# Patient Record
Sex: Male | Born: 1945 | Race: White | Hispanic: No | Marital: Married | Smoking: Never smoker
Health system: Southern US, Community
[De-identification: ages and names within clinical notes are randomized; demographics above are authoritative.]

## PROBLEM LIST (undated history)

## (undated) DIAGNOSIS — H269 Unspecified cataract: Secondary | ICD-10-CM

## (undated) DIAGNOSIS — M810 Age-related osteoporosis without current pathological fracture: Secondary | ICD-10-CM

## (undated) DIAGNOSIS — E785 Hyperlipidemia, unspecified: Secondary | ICD-10-CM

## (undated) DIAGNOSIS — F32A Depression, unspecified: Secondary | ICD-10-CM

## (undated) DIAGNOSIS — G4730 Sleep apnea, unspecified: Secondary | ICD-10-CM

## (undated) DIAGNOSIS — E119 Type 2 diabetes mellitus without complications: Secondary | ICD-10-CM

## (undated) DIAGNOSIS — F419 Anxiety disorder, unspecified: Secondary | ICD-10-CM

## (undated) DIAGNOSIS — F329 Major depressive disorder, single episode, unspecified: Secondary | ICD-10-CM

## (undated) DIAGNOSIS — M1990 Unspecified osteoarthritis, unspecified site: Secondary | ICD-10-CM

## (undated) DIAGNOSIS — I639 Cerebral infarction, unspecified: Secondary | ICD-10-CM

## (undated) DIAGNOSIS — I10 Essential (primary) hypertension: Secondary | ICD-10-CM

## (undated) DIAGNOSIS — I4891 Unspecified atrial fibrillation: Secondary | ICD-10-CM

## (undated) HISTORY — DX: Unspecified cataract: H26.9

## (undated) HISTORY — DX: Sleep apnea, unspecified: G47.30

## (undated) HISTORY — PX: COLONOSCOPY: SHX174

## (undated) HISTORY — DX: Anxiety disorder, unspecified: F41.9

## (undated) HISTORY — PX: TOE AMPUTATION: SHX809

## (undated) HISTORY — DX: Age-related osteoporosis without current pathological fracture: M81.0

## (undated) HISTORY — DX: Unspecified atrial fibrillation: I48.91

## (undated) HISTORY — DX: Unspecified osteoarthritis, unspecified site: M19.90

## (undated) HISTORY — DX: Essential (primary) hypertension: I10

## (undated) HISTORY — DX: Cerebral infarction, unspecified: I63.9

## (undated) HISTORY — DX: Hyperlipidemia, unspecified: E78.5

## (undated) HISTORY — DX: Depression, unspecified: F32.A

## (undated) HISTORY — DX: Type 2 diabetes mellitus without complications: E11.9

---
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
## 2008-05-05 HISTORY — PX: CORONARY ARTERY BYPASS GRAFT: SHX141

## 2008-05-05 HISTORY — PX: COX-MAZE MICROWAVE ABLATION: SHX1404

## 2018-05-05 HISTORY — PX: ANKLE FRACTURE SURGERY: SHX122

## 2019-06-30 ENCOUNTER — Ambulatory Visit: Payer: Self-pay | Attending: Internal Medicine

## 2019-06-30 DIAGNOSIS — Z23 Encounter for immunization: Secondary | ICD-10-CM | POA: Insufficient documentation

## 2019-06-30 NOTE — Progress Notes (Signed)
   Covid-19 Vaccination Clinic  Name:  Kevin Daniel    MRN: [id number] DOB: 03/08/1946  06/30/2019  Kevin Daniel was observed post Covid-19 immunization for 15 minutes without incidence. He was provided with Vaccine Information Sheet and instruction to access the V-Safe system.   Kevin Daniel was instructed to call 911 with any severe reactions post vaccine: Marland Kitchen Difficulty breathing  . Swelling of your face and throat  . A fast heartbeat  . A bad rash all over your body  . Dizziness and weakness    Immunizations Administered    Name Date Dose VIS Date Route   Pfizer COVID-19 Vaccine 06/30/2019  2:16 PM 0.3 mL 04/15/2019 Intramuscular   Manufacturer: [place]   Lot: [id number]   [place]: [id number]

## 2019-07-27 ENCOUNTER — Ambulatory Visit: Payer: Self-pay | Attending: Internal Medicine

## 2019-07-27 DIAGNOSIS — Z23 Encounter for immunization: Secondary | ICD-10-CM

## 2019-07-27 NOTE — Progress Notes (Signed)
   Covid-19 Vaccination Clinic  Name:  Kevin Daniel    MRN: [id number] DOB: 10/04/1945  07/27/2019  Kevin Daniel was observed post Covid-19 immunization for 30 minutes based on pre-vaccination screening without incident. He was provided with Vaccine Information Sheet and instruction to access the V-Safe system.   Kevin Daniel was instructed to call 911 with any severe reactions post vaccine: Marland Kitchen Difficulty breathing  . Swelling of face and throat  . A fast heartbeat  . A bad rash all over body  . Dizziness and weakness   Immunizations Administered    Name Date Dose VIS Date Route   Pfizer COVID-19 Vaccine 07/27/2019 12:25 PM 0.3 mL 04/15/2019 Intramuscular   Manufacturer: [place]   Lot: [id number]   [place]: [id number]

## 2019-09-23 ENCOUNTER — Encounter: Payer: Self-pay | Admitting: Physician Assistant

## 2019-09-23 ENCOUNTER — Ambulatory Visit (INDEPENDENT_AMBULATORY_CARE_PROVIDER_SITE_OTHER): Payer: Medicare Other | Admitting: Physician Assistant

## 2019-09-23 ENCOUNTER — Other Ambulatory Visit: Payer: Self-pay

## 2019-09-23 VITALS — BP 108/70 | HR 64 | Temp 98.0°F | Resp 16 | Ht 66.0 in | Wt 213.0 lb

## 2019-09-23 DIAGNOSIS — L89159 Pressure ulcer of sacral region, unspecified stage: Secondary | ICD-10-CM | POA: Diagnosis not present

## 2019-09-23 DIAGNOSIS — K529 Noninfective gastroenteritis and colitis, unspecified: Secondary | ICD-10-CM

## 2019-09-23 DIAGNOSIS — N182 Chronic kidney disease, stage 2 (mild): Secondary | ICD-10-CM | POA: Diagnosis not present

## 2019-09-23 DIAGNOSIS — I10 Essential (primary) hypertension: Secondary | ICD-10-CM

## 2019-09-23 DIAGNOSIS — Z794 Long term (current) use of insulin: Secondary | ICD-10-CM

## 2019-09-23 DIAGNOSIS — E1122 Type 2 diabetes mellitus with diabetic chronic kidney disease: Secondary | ICD-10-CM

## 2019-09-23 DIAGNOSIS — E785 Hyperlipidemia, unspecified: Secondary | ICD-10-CM

## 2019-09-23 DIAGNOSIS — R5382 Chronic fatigue, unspecified: Secondary | ICD-10-CM

## 2019-09-23 DIAGNOSIS — E1159 Type 2 diabetes mellitus with other circulatory complications: Secondary | ICD-10-CM

## 2019-09-23 DIAGNOSIS — E1169 Type 2 diabetes mellitus with other specified complication: Secondary | ICD-10-CM

## 2019-09-23 MED ORDER — CEPHALEXIN 500 MG PO CAPS: 500.0000 mg | ORAL_CAPSULE | Freq: Two times a day (BID) | ORAL | 0 refills | Status: AC

## 2019-09-23 MED ORDER — RESTORE BARRIER CREA: TOPICAL_CREAM | 0 refills | Status: DC

## 2019-09-23 NOTE — Progress Notes (Signed)
Patient presents to clinic today with his wife to establish care. They have previously been followed by Dr. Alfonso Ramus in [place], [place] but this provider is too far away from their new address. Records have been requested.   Patient with known PMH significant for CVA (2020), DM II, HTN, HLD, Gout and Psoriasis. Is currently on a medication regimen of amlodipine 10 mg QD, Chlorthalidone 25 mg QD, Losartan 50 mg QD, Metoprolol 75 mg BID, Allopurinol 300 mg QD, Lexapro 20 mg QD and Trazodone 100 mg nightly. Patient was on atorvastatin and metformin but recently discontinued due to potential side effect (see below).   Patient has been having issue with loose stool, previously followed by Gastroenterology. Wife notes that they felt was related to medications but no further workup was done. Was taken off of his Metformin and Atorvastatin to see if these were big contributors to GI issue. Notes being off of this medications for a few months, still having loose caliber stools. Notes occasional bowel frequency but not a frequent issue. Denies any melena, hematochezia or tenesmus. Denies any recent change to diet. Tries to stay hydrate but wife states he could do a lot better.  Denies fever, chills, nausea or vomiting. Denies recent travel or sick contact. Denies recent antibiotic use.   Patient also having issue with a sore on his sacral region, present for a few weeks. Patient and wife note this has been present due to him sitting in his chair most of the day. Does not get up often due to concern about fall. Only has a cane as assistance device. Has noted increased tenderness in the area and also having warmth now instead of just redness. Denies drainage. Wife tries to keep the area covered as best she can.  Past Medical History:  Diagnosis Date  . Arthritis   . Depression   . Diabetes mellitus without complication ([place])   . Hyperlipidemia   . Hypertension   . Osteoporosis   . Sleep apnea     Past  Surgical History:  Procedure Laterality Date  . ANKLE FRACTURE SURGERY Left 2020  . APPENDECTOMY  1964    Current Outpatient Medications on File Prior to Visit  Medication Sig Dispense Refill  . allopurinol (ZYLOPRIM) 300 MG tablet Take 300 mg by mouth daily.    Marland Kitchen amLODipine (NORVASC) 10 MG tablet Take 1 tablet by mouth daily.    . chlorthalidone (HYGROTON) 25 MG tablet Take 1 tablet by mouth daily.    Marland Kitchen escitalopram (LEXAPRO) 20 MG tablet Take 20 mg by mouth daily.    Marland Kitchen loperamide (IMODIUM A-D) 2 MG tablet Take 2 mg by mouth as needed for diarrhea or loose stools.    Marland Kitchen losartan (COZAAR) 50 MG tablet Take 50 mg by mouth daily.    . metoprolol tartrate (LOPRESSOR) 50 MG tablet Take 75 mg by mouth 2 (two) times daily.    . traMADol (ULTRAM) 50 MG tablet Take 50 mg by mouth every 6 (six) hours as needed.    . traZODone (DESYREL) 100 MG tablet Take 100 mg by mouth at bedtime.     No current facility-administered medications on file prior to visit.    Allergies  Allergen Reactions  . Lisinopril Other (See Comments) and Swelling    Family History  Problem Relation Age of Onset  . Arthritis Mother   . COPD Mother   . Early death Mother   . Stroke Father   . Arthritis Brother   . Stroke  Brother     Social History   Socioeconomic History  . Marital status: Married    Spouse name: [place]  . Number of children: Not on file  . Years of education: Not on file  . Highest education level: Not on file  Occupational History  . Not on file  Tobacco Use  . Smoking status: Never Smoker  . Smokeless tobacco: Never Used  Substance and Sexual Activity  . Alcohol use: Yes  . Drug use: Not Currently  . Sexual activity: Not Currently  Other Topics Concern  . Not on file  Social History Narrative  . Not on file   Social Determinants of Health   Financial Resource Strain:   . Difficulty of Paying Living Expenses:   Food Insecurity:   . Worried About Charity fundraiser in the  Last Year:   . Arboriculturist in the Last Year:   Transportation Needs:   . Film/video editor (Medical):   Marland Kitchen Lack of Transportation (Non-Medical):   Physical Activity:   . Days of Exercise per Week:   . Minutes of Exercise per Session:   Stress:   . Feeling of Stress :   Social Connections:   . Frequency of Communication with Friends and Family:   . Frequency of Social Gatherings with Friends and Family:   . Attends Religious Services:   . Active Member of Clubs or Organizations:   . Attends Archivist Meetings:   Marland Kitchen Marital Status:   Intimate Partner Violence:   . Fear of Current or Ex-Partner:   . Emotionally Abused:   Marland Kitchen Physically Abused:   . Sexually Abused:    ROS Pertinent ROS are listed in the HPI.  BP 108/70   Pulse 64   Temp 98 F (36.7 C) (Temporal)   Resp 16   Ht 5\' 6"  (1.676 m)   Wt 213 lb (96.6 kg)   SpO2 98%   BMI 34.38 kg/m   Physical Exam Vitals reviewed. Exam conducted with a chaperone present.  Constitutional:      Appearance: Normal appearance.  HENT:     Head: Normocephalic and atraumatic.  Eyes:     Conjunctiva/sclera: Conjunctivae normal.  Cardiovascular:     Rate and Rhythm: Normal rate and regular rhythm.     Pulses: Normal pulses.     Heart sounds: Normal heart sounds.  Pulmonary:     Effort: Pulmonary effort is normal.     Breath sounds: Normal breath sounds.  Abdominal:     General: Bowel sounds are normal. There is no distension.     Palpations: Abdomen is soft.     Tenderness: There is no abdominal tenderness.  Musculoskeletal:     Cervical back: Neck supple.       Back:  Neurological:     General: No focal deficit present.     Mental Status: He is alert.  Psychiatric:        Mood and Affect: Mood normal.      Assessment/Plan: 1. Pressure injury of skin of sacral region, unspecified injury stage Stage II with signs of mild infection. Will initiate [facility] RN for skilled wound care. Area cleaned in office  today with non-adherent dressing applied. Discussed use of barrier creams. Rx sent. Needs to turn every 2 hours while sitting. He is mainly sedentary due to his concern of fall from chronic weakness 2/2 CVA in 2020. Unsure why he has not been given a walker. Will send in  Rx for this. Will also send in [facility] PT to facilitate safety in the home as well as strengthening exercises to make him more mobile in the safest manner possible. This should help healing and reduce risk of subsequent ulceration. Will start Keflex 500 mg BID for mild cellulitis in this region. Follow-up scheduled.   2. Chronic diarrhea Does note seem related to medications as symptoms persist despite his Metformin and Atorvastatin being stopped. Rechecking labs today to assess current status of lipids and glucose as meds need to be restart ASAP if things are not at goal. For stool, ok to continue immodium. Start daily Probiotic. BRAT diet reviewed. Will obtain labs to include CBC and stool panel. May need different GI specialist if workup negative as colonoscopy would need to be concerned.  - CBC with Differential/Platelet - Stool Culture - Ova and parasite examination - Clostridium difficile Toxin B, Qualitative, Real-Time PCR([facility])  3. Chronic fatigue Likely multifactorial -- deconditioning, depression, DM II, pain 2/2 sacral ulceration. Will begin working on each of these. Labs today as noted.   4. Type 2 diabetes mellitus with stage 2 chronic kidney disease, with long-term current use of insulin ([place]) Patient mentions at end of the visit that he is actually also on Novolin N and Novolin R. Is unsure of the doses stating he follows a sliding scale. He cannot repeat what the scale is. Has not been checking glucose. He is to start checking TID and record. Will work on [employer] for him. He is to call us ASAP with the sliding scales so we can review and make sure appropriate. I have a lot of concerns about him being on insulin  in general giving he does not keep track of sugars. Will repeat labs today to assess current glycemic state so we can make the most appropriate adjustments. May benefit from Endo input.  - Comprehensive metabolic panel - Hemoglobin A1c - TSH - Lipid panel  5. Hypertension associated with diabetes ([place]) BP stable today. Is on a regimen of chlorthalidone despite history of gout and current need for allopurinol. Will get records to see if there wa another compelling indication for him to be placed on a thiazide despite history of gout as patient is poor historian. Will review records and make appropriate changes. If no compelling indications, especially giving BP lower end of normal, would hold the chlorthalidone, monitor BP and then reassess uric acid level. We may be able to get him off of a few things.  - Comprehensive metabolic panel - Hemoglobin A1c - TSH - Lipid panel  6. Hyperlipidemia associated with type 2 diabetes mellitus ([place]) Repeat fasting labs today. - Hemoglobin A1c - TSH - Lipid panel  This visit occurred during the SARS-CoV-2 public health emergency.  Safety protocols were in place, including screening questions prior to the visit, additional usage of staff PPE, and extensive cleaning of exam room while observing appropriate contact time as indicated for disinfecting solutions.   Leeanne Rio, PA-C

## 2019-09-23 NOTE — Patient Instructions (Signed)
Please go to the lab today for blood work.  I will call you with your results. We will alter treatment regimen(s) if indicated by your results.   I am also requested records from your previous providers so I can review them and determine what all has been done previously and what care gaps need closing.   For the pressure sore, I am placing an order for a rollator walker for you to have at home so you can feel more comfortable moving around without worrying about falls. This will reduce time spent sitting on the area.  I also want you to wedge a pillow under one side of the buttock and switch to other side every hour you are sitting -- this will help alleviate pressure on the area and promote healing.  I want you to get up every hour you are sitting in the day and walk with the walker a bit, just to get off of the area. (Only if supervised).  I am sending a skilled nurse out to the home along with home health PT to do a fall risk assessment and provider some skilled wound care at the home to help speed up recovery.   I am going to give you an antibiotic because I feel you have developed a mild cellulitis in the area contributing to pain. Take the Keflex as directed.   I am sending in barrier cream to apply to the area as directed after cleaning the area with warm clean water and mild soap. Pat dry before applying.   I am also going to set you up with neurology for further evaluation of the tremors and memory changes -- due to concern of parkinsonism.   Will make other changes to medications based on lab results.

## 2019-09-24 LAB — CBC WITH DIFFERENTIAL/PLATELET
Absolute Monocytes: 904 cells/uL (ref 200–950)
Basophils Absolute: 72 cells/uL (ref 0–200)
Basophils Relative: 0.9 %
Eosinophils Absolute: 208 cells/uL (ref 15–500)
Eosinophils Relative: 2.6 %
HCT: 44 % (ref 38.5–50.0)
Hemoglobin: 15.1 g/dL (ref 13.2–17.1)
Lymphs Abs: 1096 cells/uL (ref 850–3900)
MCH: 33.3 pg — ABNORMAL HIGH (ref 27.0–33.0)
MCHC: 34.3 g/dL (ref 32.0–36.0)
MCV: 96.9 fL (ref 80.0–100.0)
MPV: 10.7 fL (ref 7.5–12.5)
Monocytes Relative: 11.3 %
Neutro Abs: 5720 cells/uL (ref 1500–7800)
Neutrophils Relative %: 71.5 %
Platelets: 238 10*3/uL (ref 140–400)
RBC: 4.54 10*6/uL (ref 4.20–5.80)
RDW: 12 % (ref 11.0–15.0)
Total Lymphocyte: 13.7 %
WBC: 8 10*3/uL (ref 3.8–10.8)

## 2019-09-24 LAB — HEMOGLOBIN A1C
Hgb A1c MFr Bld: 11.1 % of total Hgb — ABNORMAL HIGH (ref <5.7)
Mean Plasma Glucose: 272 (calc)
eAG (mmol/L): 15.1 (calc)

## 2019-09-24 LAB — COMPREHENSIVE METABOLIC PANEL
AG Ratio: 1 (calc) (ref 1.0–2.5)
ALT: 14 U/L (ref 9–46)
AST: 14 U/L (ref 10–35)
Albumin: 3.8 g/dL (ref 3.6–5.1)
Alkaline phosphatase (APISO): 132 U/L (ref 35–144)
BUN/Creatinine Ratio: 25 (calc) — ABNORMAL HIGH (ref 6–22)
BUN: 28 mg/dL — ABNORMAL HIGH (ref 7–25)
CO2: 26 mmol/L (ref 20–32)
Calcium: 9.6 mg/dL (ref 8.6–10.3)
Chloride: 88 mmol/L — ABNORMAL LOW (ref 98–110)
Creat: 1.1 mg/dL (ref 0.70–1.18)
Globulin: 3.8 g/dL (calc) — ABNORMAL HIGH (ref 1.9–3.7)
Glucose, Bld: 396 mg/dL — ABNORMAL HIGH (ref 65–99)
Potassium: 4.3 mmol/L (ref 3.5–5.3)
Sodium: 128 mmol/L — ABNORMAL LOW (ref 135–146)
Total Bilirubin: 1.2 mg/dL (ref 0.2–1.2)
Total Protein: 7.6 g/dL (ref 6.1–8.1)

## 2019-09-24 LAB — LIPID PANEL
Cholesterol: 186 mg/dL (ref <200)
HDL: 47 mg/dL (ref >=40)
LDL Cholesterol (Calc): 117 mg/dL (calc) — ABNORMAL HIGH
Non-HDL Cholesterol (Calc): 139 mg/dL (calc) — ABNORMAL HIGH (ref <130)
Total CHOL/HDL Ratio: 4 (calc) (ref <5.0)
Triglycerides: 113 mg/dL (ref <150)

## 2019-09-24 LAB — TSH: TSH: 1.88 mIU/L (ref 0.40–4.50)

## 2019-09-28 ENCOUNTER — Telehealth: Payer: Self-pay | Admitting: Physician Assistant

## 2019-09-28 NOTE — Telephone Encounter (Signed)
Received response back from nurse

## 2019-09-30 ENCOUNTER — Other Ambulatory Visit: Payer: Self-pay | Admitting: Emergency Medicine

## 2019-09-30 DIAGNOSIS — E1122 Type 2 diabetes mellitus with diabetic chronic kidney disease: Secondary | ICD-10-CM

## 2019-09-30 DIAGNOSIS — N182 Chronic kidney disease, stage 2 (mild): Secondary | ICD-10-CM

## 2019-09-30 MED ORDER — FREESTYLE LIBRE 14 DAY SENSOR MISC: 11 refills | Status: DC

## 2019-09-30 MED ORDER — METFORMIN HCL ER (OSM) 1000 MG PO TB24: 1000.0000 mg | ORAL_TABLET | Freq: Every day | ORAL | 1 refills | Status: DC

## 2019-09-30 MED ORDER — FREESTYLE LIBRE 14 DAY READER DEVI: 11 refills | Status: AC

## 2019-10-03 DIAGNOSIS — E1159 Type 2 diabetes mellitus with other circulatory complications: Secondary | ICD-10-CM | POA: Insufficient documentation

## 2019-10-03 DIAGNOSIS — R5382 Chronic fatigue, unspecified: Secondary | ICD-10-CM | POA: Insufficient documentation

## 2019-10-03 DIAGNOSIS — E1169 Type 2 diabetes mellitus with other specified complication: Secondary | ICD-10-CM | POA: Insufficient documentation

## 2019-10-03 DIAGNOSIS — E785 Hyperlipidemia, unspecified: Secondary | ICD-10-CM | POA: Insufficient documentation

## 2019-10-03 DIAGNOSIS — L89159 Pressure ulcer of sacral region, unspecified stage: Secondary | ICD-10-CM | POA: Insufficient documentation

## 2019-10-03 DIAGNOSIS — E1122 Type 2 diabetes mellitus with diabetic chronic kidney disease: Secondary | ICD-10-CM | POA: Insufficient documentation

## 2019-10-03 DIAGNOSIS — K529 Noninfective gastroenteritis and colitis, unspecified: Secondary | ICD-10-CM | POA: Insufficient documentation

## 2019-10-06 ENCOUNTER — Telehealth: Payer: Self-pay | Admitting: Physician Assistant

## 2019-10-06 ENCOUNTER — Other Ambulatory Visit: Payer: Self-pay | Admitting: Physician Assistant

## 2019-10-06 NOTE — Telephone Encounter (Signed)
Dyann Ruddle from [facility] called in asking for verbal orders to see pt for skilled nursing to treat his wound for   1 wk 1  2X a wk for 6wks  1X a wk for 2wks   Please advise and it is ok to LM on VM if no answer.   [phone]

## 2019-10-06 NOTE — Telephone Encounter (Signed)
Ok to give verbal order.

## 2019-10-07 NOTE — Telephone Encounter (Signed)
Spoke with Dyann Ruddle at [facility] and verbal order was given.

## 2019-10-09 ENCOUNTER — Encounter: Payer: Self-pay | Admitting: Physician Assistant

## 2019-10-10 MED ORDER — CHLORTHALIDONE 25 MG PO TABS: 25.0000 mg | ORAL_TABLET | Freq: Every day | ORAL | 0 refills | Status: DC

## 2019-10-10 MED ORDER — METOPROLOL TARTRATE 50 MG PO TABS: 75.0000 mg | ORAL_TABLET | Freq: Two times a day (BID) | ORAL | 0 refills | Status: DC

## 2019-10-10 MED ORDER — AMLODIPINE BESYLATE 10 MG PO TABS: 10.0000 mg | ORAL_TABLET | Freq: Every day | ORAL | 0 refills | Status: DC

## 2019-10-10 MED ORDER — LOSARTAN POTASSIUM 50 MG PO TABS: 50.0000 mg | ORAL_TABLET | Freq: Every day | ORAL | 0 refills | Status: DC

## 2019-10-10 MED ORDER — ALLOPURINOL 300 MG PO TABS: 300.0000 mg | ORAL_TABLET | Freq: Every day | ORAL | 0 refills | Status: DC

## 2019-10-10 MED ORDER — ESCITALOPRAM OXALATE 20 MG PO TABS: 20.0000 mg | ORAL_TABLET | Freq: Every day | ORAL | 0 refills | Status: DC

## 2019-10-11 ENCOUNTER — Encounter: Payer: Self-pay | Admitting: Physician Assistant

## 2019-10-14 DIAGNOSIS — I10 Essential (primary) hypertension: Secondary | ICD-10-CM

## 2019-10-14 DIAGNOSIS — E785 Hyperlipidemia, unspecified: Secondary | ICD-10-CM

## 2019-10-14 DIAGNOSIS — Z9181 History of falling: Secondary | ICD-10-CM

## 2019-10-14 DIAGNOSIS — I69398 Other sequelae of cerebral infarction: Secondary | ICD-10-CM | POA: Diagnosis not present

## 2019-10-14 DIAGNOSIS — E1122 Type 2 diabetes mellitus with diabetic chronic kidney disease: Secondary | ICD-10-CM

## 2019-10-14 DIAGNOSIS — M6281 Muscle weakness (generalized): Secondary | ICD-10-CM | POA: Diagnosis not present

## 2019-10-14 DIAGNOSIS — L89152 Pressure ulcer of sacral region, stage 2: Secondary | ICD-10-CM | POA: Diagnosis not present

## 2019-10-14 DIAGNOSIS — K529 Noninfective gastroenteritis and colitis, unspecified: Secondary | ICD-10-CM

## 2019-10-14 DIAGNOSIS — N182 Chronic kidney disease, stage 2 (mild): Secondary | ICD-10-CM

## 2019-10-14 DIAGNOSIS — E1169 Type 2 diabetes mellitus with other specified complication: Secondary | ICD-10-CM

## 2019-10-14 DIAGNOSIS — F329 Major depressive disorder, single episode, unspecified: Secondary | ICD-10-CM

## 2019-10-14 DIAGNOSIS — E1159 Type 2 diabetes mellitus with other circulatory complications: Secondary | ICD-10-CM

## 2019-10-14 DIAGNOSIS — Z7984 Long term (current) use of oral hypoglycemic drugs: Secondary | ICD-10-CM

## 2019-10-14 DIAGNOSIS — M109 Gout, unspecified: Secondary | ICD-10-CM

## 2019-10-14 DIAGNOSIS — Z4800 Encounter for change or removal of nonsurgical wound dressing: Secondary | ICD-10-CM

## 2019-10-14 DIAGNOSIS — L03818 Cellulitis of other sites: Secondary | ICD-10-CM | POA: Diagnosis not present

## 2019-10-14 DIAGNOSIS — M810 Age-related osteoporosis without current pathological fracture: Secondary | ICD-10-CM

## 2019-10-14 DIAGNOSIS — M1990 Unspecified osteoarthritis, unspecified site: Secondary | ICD-10-CM

## 2019-10-14 LAB — STOOL CULTURE
MICRO NUMBER:: 10554575
MICRO NUMBER:: 10554576
MICRO NUMBER:: 10554578
SHIGA RESULT:: NOT DETECTED
SPECIMEN QUALITY:: ADEQUATE
SPECIMEN QUALITY:: ADEQUATE
SPECIMEN QUALITY:: ADEQUATE

## 2019-10-14 LAB — OVA AND PARASITE EXAMINATION
CONCENTRATE RESULT:: NONE SEEN
MICRO NUMBER:: 10554577
SPECIMEN QUALITY:: ADEQUATE
TRICHROME RESULT:: NONE SEEN

## 2019-10-14 LAB — CLOSTRIDIUM DIFFICILE TOXIN B, QUALITATIVE, REAL-TIME PCR: Toxigenic C. Difficile by PCR: NOT DETECTED

## 2019-10-17 ENCOUNTER — Encounter: Payer: Self-pay | Admitting: Physician Assistant

## 2019-10-17 MED ORDER — LOSARTAN POTASSIUM 50 MG PO TABS: 50.0000 mg | ORAL_TABLET | Freq: Every day | ORAL | 0 refills | Status: DC

## 2019-10-27 ENCOUNTER — Telehealth: Payer: Self-pay | Admitting: Physician Assistant

## 2019-10-27 NOTE — Telephone Encounter (Signed)
Please call pt and Joelene Millin at [phone] if any changes to medications are made.

## 2019-10-27 NOTE — Telephone Encounter (Signed)
Kevin Daniel from [facility] called in stating that pt's bp has been running low. Today his BP was 84/58.  Past readings have been: 98/54, 90/56, 100/62, the highest 106/62.   Heart Rate was 50 and 63  Pt is on amlodipine, losartan, and metoprolol tartrate, Please advise

## 2019-10-27 NOTE — Telephone Encounter (Signed)
Please advise 

## 2019-10-27 NOTE — Telephone Encounter (Signed)
Called hh nurse to inform of recommendations. She asked me to call the patient because she was not currently with him. Called patient and informed in to hold the Amlodipine and schedule a follow up with PCP. Patient states he does not wish to come into the office. I gave the patient the option of a virtual visit and he declined. He states his nurse comes out on Monday and he will call and relay the information regarding his blood pressure readings to the office.

## 2019-10-27 NOTE — Telephone Encounter (Signed)
Pt needs to hold his Amlodipine and schedule f/u w/ Einar Pheasant next week when he returns.  They should continue to monitor BPs and let us know if consistently <100/50 or >145/90

## 2019-11-01 NOTE — Telephone Encounter (Signed)
Unfortunately at this point the patient has been very noncompliant with my recommendations for his care and it seems he continues to refuse advice even from Dr. Birdie Riddle in my absence. He has only been a patient a few months but in that time has refused most care recommendations. This makes it very difficult to care for him and I cannot continue to supervise his neglect of his own health. Will proceed with dismissal.   Tonia [place].

## 2019-11-03 ENCOUNTER — Encounter: Payer: Self-pay | Admitting: Physician Assistant

## 2019-11-03 ENCOUNTER — Telehealth: Payer: Self-pay | Admitting: General Practice

## 2019-11-03 ENCOUNTER — Other Ambulatory Visit: Payer: Self-pay

## 2019-11-03 DIAGNOSIS — K529 Noninfective gastroenteritis and colitis, unspecified: Secondary | ICD-10-CM

## 2019-11-03 NOTE — Telephone Encounter (Signed)
Patient and HH made aware of changes. Pt was scheduled a follow up appt on Thursday 11/10/19.

## 2019-11-03 NOTE — Progress Notes (Signed)
am

## 2019-11-03 NOTE — Telephone Encounter (Signed)
Pt needs to decrease Metoprolol to 1 tab BID rather than 1.5 tabs BID.  He should continue to hold his Amlodipine.  He needs an office visit w/ PCP to assess BPs and make further medication adjustments.  Will defer on GI consultation to PCP

## 2019-11-03 NOTE — Telephone Encounter (Signed)
Joelene Millin with called in stating that patient has been holding Amlodipine his BP is 88/56 and pulse is 60 today. He is still taking metoprolol and losartan.   Pt is also complaining of chronic diarrhea. Has been taking immodium TID and states that it is not working anymore. Would like a consult to GI for possible colonoscopy.   Ok to call [place] back and LMOVM.

## 2019-11-03 NOTE — Telephone Encounter (Signed)
Ok to place referral. Will also see patient on 11/10/19 for follow-up.

## 2019-11-04 ENCOUNTER — Telehealth: Payer: Self-pay | Admitting: Physician Assistant

## 2019-11-04 NOTE — Telephone Encounter (Signed)
I have placed home health orders in the bin up front with a charge sheet.   Order # [id number]

## 2019-11-10 ENCOUNTER — Ambulatory Visit (INDEPENDENT_AMBULATORY_CARE_PROVIDER_SITE_OTHER): Payer: Medicare Other | Admitting: Physician Assistant

## 2019-11-10 ENCOUNTER — Encounter: Payer: Self-pay | Admitting: Physician Assistant

## 2019-11-10 ENCOUNTER — Other Ambulatory Visit: Payer: Self-pay

## 2019-11-10 VITALS — BP 108/60 | HR 69 | Temp 97.9°F | Resp 16 | Ht 66.0 in | Wt 206.0 lb

## 2019-11-10 DIAGNOSIS — I152 Hypertension secondary to endocrine disorders: Secondary | ICD-10-CM

## 2019-11-10 DIAGNOSIS — E1159 Type 2 diabetes mellitus with other circulatory complications: Secondary | ICD-10-CM | POA: Diagnosis not present

## 2019-11-10 DIAGNOSIS — I10 Essential (primary) hypertension: Secondary | ICD-10-CM | POA: Diagnosis not present

## 2019-11-10 LAB — CBC WITH DIFFERENTIAL/PLATELET
Basophils Absolute: 0.2 10*3/uL — ABNORMAL HIGH (ref 0.0–0.1)
Basophils Relative: 1.4 % (ref 0.0–3.0)
Eosinophils Absolute: 0.2 10*3/uL (ref 0.0–0.7)
Eosinophils Relative: 1.5 % (ref 0.0–5.0)
HCT: 43 % (ref 39.0–52.0)
Hemoglobin: 14.1 g/dL (ref 13.0–17.0)
Lymphocytes Relative: 10.9 % — ABNORMAL LOW (ref 12.0–46.0)
Lymphs Abs: 1.3 10*3/uL (ref 0.7–4.0)
MCHC: 32.8 g/dL (ref 30.0–36.0)
MCV: 99.2 fl (ref 78.0–100.0)
Monocytes Absolute: 0.8 10*3/uL (ref 0.1–1.0)
Monocytes Relative: 6.6 % (ref 3.0–12.0)
Neutro Abs: 9.3 10*3/uL — ABNORMAL HIGH (ref 1.4–7.7)
Neutrophils Relative %: 79.6 % — ABNORMAL HIGH (ref 43.0–77.0)
Platelets: 366 10*3/uL (ref 150.0–400.0)
RBC: 4.33 Mil/uL (ref 4.22–5.81)
RDW: 14.1 % (ref 11.5–15.5)
WBC: 11.7 10*3/uL — ABNORMAL HIGH (ref 4.0–10.5)

## 2019-11-10 LAB — COMPREHENSIVE METABOLIC PANEL
ALT: 17 U/L (ref 0–53)
AST: 18 U/L (ref 0–37)
Albumin: 3.5 g/dL (ref 3.5–5.2)
Alkaline Phosphatase: 107 U/L (ref 39–117)
BUN: 19 mg/dL (ref 6–23)
CO2: 33 mEq/L — ABNORMAL HIGH (ref 19–32)
Calcium: 9.4 mg/dL (ref 8.4–10.5)
Chloride: 91 mEq/L — ABNORMAL LOW (ref 96–112)
Creatinine, Ser: 0.82 mg/dL (ref 0.40–1.50)
GFR: 91.96 mL/min (ref >60.00)
Glucose, Bld: 194 mg/dL — ABNORMAL HIGH (ref 70–99)
Potassium: 4 mEq/L (ref 3.5–5.1)
Sodium: 135 mEq/L (ref 135–145)
Total Bilirubin: 0.9 mg/dL (ref 0.2–1.2)
Total Protein: 7.4 g/dL (ref 6.0–8.3)

## 2019-11-10 NOTE — Patient Instructions (Addendum)
Please go to the lab today for blood work.  I will call you with your results. We will alter treatment regimen(s) if indicated by your results.   Please continue current medication regimen.  We will get records from Dr. Benson Norway to submit to [facility] so they can get you scheduled and taken care of.   Please follow-up after 12/24/2019 for Diabetes as this is when we can recheck A1C, etc.

## 2019-11-10 NOTE — Progress Notes (Signed)
Patient presents to clinic today with his wife for follow-up of low blood pressure readings.  Patient with history of hypertension, previously on multidrug regimen including amlodipine, metoprolol and chlorthalidone.  Physical therapy has been coming out to the house, last week noting blood pressure to be quite low 80/60.  As such metoprolol was decreased back to 1 tablet daily.  Repeat blood pressure via physical therapy still at 88 systolic.  As such amlodipine was stopped.  Patient has continued lower dose of metoprolol and chlorthalidone as directed.  Has been checking blood pressure at home noting back up to 355-7 18 systolic.  Denies noting any chest pain, shortness of breath, lightheadedness or dizziness throughout the past few weeks.   BP Readings from Last 3 Encounters:  11/10/19 108/60  09/23/19 108/70     Past Medical History:  Diagnosis Date   Arthritis    Depression    Diabetes mellitus without complication ([place])    Hyperlipidemia    Hypertension    Osteoporosis    Sleep apnea     Current Outpatient Medications on File Prior to Visit  Medication Sig Dispense Refill   allopurinol (ZYLOPRIM) 300 MG tablet Take 1 tablet (300 mg total) by mouth daily. 90 tablet 0   chlorthalidone (HYGROTON) 25 MG tablet Take 1 tablet (25 mg total) by mouth daily. 90 tablet 0   Continuous Blood Gluc Receiver (FREESTYLE LIBRE 14 DAY READER) DEVI Apply reader for monitoring of continuous glucose monitoring 1 each 11   Continuous Blood Gluc Sensor (FREESTYLE LIBRE 14 DAY SENSOR) MISC Apply sensor every 14 days for continuous blood glucose monitoring 1 each 11   escitalopram (LEXAPRO) 20 MG tablet Take 1 tablet (20 mg total) by mouth daily. 90 tablet 0   insulin NPH Human (NOVOLIN N) 100 UNIT/ML injection Inject into the skin. Sliding scale     insulin regular (NOVOLIN R) 100 units/mL injection Inject into the skin 3 (three) times daily before meals.     loperamide (IMODIUM A-D) 2  MG tablet Take 2 mg by mouth as needed for diarrhea or loose stools.     losartan (COZAAR) 50 MG tablet Take 1 tablet (50 mg total) by mouth daily. 90 tablet 0   metoprolol tartrate (LOPRESSOR) 50 MG tablet Take 1.5 tablets (75 mg total) by mouth 2 (two) times daily. (Patient taking differently: Take 50 mg by mouth 2 (two) times daily. ) 135 tablet 0   traMADol (ULTRAM) 50 MG tablet Take 50 mg by mouth every 6 (six) hours as needed.     traZODone (DESYREL) 100 MG tablet Take 100 mg by mouth at bedtime.     No current facility-administered medications on file prior to visit.    Allergies  Allergen Reactions   Lisinopril Other (See Comments) and Swelling    Family History  Problem Relation Age of Onset   Arthritis Mother    COPD Mother    Early death Mother    Stroke Father    Arthritis Brother    Stroke Brother     Social History   Socioeconomic History   Marital status: Married    Spouse name: [place]   Number of children: Not on file   Years of education: Not on file   Highest education level: Not on file  Occupational History   Not on file  Tobacco Use   Smoking status: Never Smoker   Smokeless tobacco: Never Used  Vaping Use   Vaping Use: Never used  Substance  and Sexual Activity   Alcohol use: Yes   Drug use: Not Currently   Sexual activity: Not Currently  Other Topics Concern   Not on file  Social History Narrative   Not on file   Social Determinants of Health   Financial Resource Strain:    Difficulty of Paying Living Expenses:   Food Insecurity:    Worried About Running Out of Food in the Last Year:    Arboriculturist in the Last Year:   Transportation Needs:    Film/video editor (Medical):    Lack of Transportation (Non-Medical):   Physical Activity:    Days of Exercise per Week:    Minutes of Exercise per Session:   Stress:    Feeling of Stress :   Social Connections:    Frequency of Communication with  Friends and Family:    Frequency of Social Gatherings with Friends and Family:    Attends Religious Services:    Active Member of Clubs or Organizations:    Attends Archivist Meetings:    Marital Status:    Review of Systems - See HPI.  All other ROS are negative.  Resp 16    Ht _0  (1.676 m)    Wt 206 lb (93.4 kg)    BMI 33.25 kg/m   Physical Exam Vitals reviewed.  Constitutional:      Appearance: Normal appearance.  HENT:     Head: Normocephalic and atraumatic.  Cardiovascular:     Rate and Rhythm: Normal rate and regular rhythm.     Heart sounds: Normal heart sounds.  Pulmonary:     Effort: Pulmonary effort is normal.     Breath sounds: Normal breath sounds.  Musculoskeletal:     Cervical back: Neck supple.  Neurological:     Mental Status: He is alert.  Psychiatric:        Mood and Affect: Mood normal.     Recent Results (from the past 2160 hour(s))  CBC with Differential/Platelet     Status: Abnormal   Collection Time: 09/23/19  2:04 PM  Result Value Ref Range   WBC 8.0 3.8 - 10.8 Thousand/uL   RBC 4.54 4.20 - 5.80 Million/uL   Hemoglobin 15.1 13.2 - 17.1 g/dL   HCT 44.0 38 - 50 %   MCV 96.9 80.0 - 100.0 fL   MCH 33.3 (H) 27.0 - 33.0 pg   MCHC 34.3 32.0 - 36.0 g/dL   RDW 12.0 11.0 - 15.0 %   Platelets 238 140 - 400 Thousand/uL   MPV 10.7 7.5 - 12.5 fL   Neutro Abs 5,720 1,500 - 7,800 cells/uL   Lymphs Abs 1,096 850 - 3,900 cells/uL   Absolute Monocytes 904 200 - 950 cells/uL   Eosinophils Absolute 208 15 - 500 cells/uL   Basophils Absolute 72 0 - 200 cells/uL   Neutrophils Relative % 71.5 %   Total Lymphocyte 13.7 %   Monocytes Relative 11.3 %   Eosinophils Relative 2.6 %   Basophils Relative 0.9 %  Comprehensive metabolic panel     Status: Abnormal   Collection Time: 09/23/19  2:04 PM  Result Value Ref Range   Glucose, Bld 396 (H) 65 - 99 mg/dL    Comment: .            Fasting reference interval . For someone without known  diabetes, a glucose value >125 mg/dL indicates that they may have diabetes and this should be confirmed with a follow-up  test. .    BUN 28 (H) 7 - 25 mg/dL   Creat 1.10 0.70 - 1.18 mg/dL    Comment: For patients >9 years of age, the reference limit for Creatinine is approximately 13% higher for people identified as African-American. .    BUN/Creatinine Ratio 25 (H) 6 - 22 (calc)   Sodium 128 (L) 135 - 146 mmol/L   Potassium 4.3 3.5 - 5.3 mmol/L   Chloride 88 (L) 98 - 110 mmol/L   CO2 26 20 - 32 mmol/L   Calcium 9.6 8.6 - 10.3 mg/dL   Total Protein 7.6 6.1 - 8.1 g/dL   Albumin 3.8 3.6 - 5.1 g/dL   Globulin 3.8 (H) 1.9 - 3.7 g/dL (calc)   AG Ratio 1.0 1.0 - 2.5 (calc)   Total Bilirubin 1.2 0.2 - 1.2 mg/dL   Alkaline phosphatase (APISO) 132 35 - 144 U/L   AST 14 10 - 35 U/L   ALT 14 9 - 46 U/L  Hemoglobin A1c     Status: Abnormal   Collection Time: 09/23/19  2:04 PM  Result Value Ref Range   Hgb A1c MFr Bld 11.1 (H) <5.7 % of total Hgb    Comment: For someone without known diabetes, a hemoglobin A1c value of 6.5% or greater indicates that they may have  diabetes and this should be confirmed with a follow-up  test. . For someone with known diabetes, a value <7% indicates  that their diabetes is well controlled and a value  greater than or equal to 7% indicates suboptimal  control. A1c targets should be individualized based on  duration of diabetes, age, comorbid conditions, and  other considerations. . Currently, no consensus exists regarding use of hemoglobin A1c for diagnosis of diabetes for children. .    Mean Plasma Glucose 272 (calc)   eAG (mmol/L) 15.1 (calc)  TSH     Status: None   Collection Time: 09/23/19  2:04 PM  Result Value Ref Range   TSH 1.88 0.40 - 4.50 mIU/L  Lipid panel     Status: Abnormal   Collection Time: 09/23/19  2:04 PM  Result Value Ref Range   Cholesterol 186 <200 mg/dL   HDL 47 > OR = 40 mg/dL   Triglycerides 113 <150 mg/dL   LDL  Cholesterol (Calc) 117 (H) mg/dL (calc)    Comment: Reference range: <100 . Desirable range <100 mg/dL for primary prevention;   <70 mg/dL for patients with CHD or diabetic patients  with > or = 2 CHD risk factors. Marland Kitchen LDL-C is now calculated using the Travante Knee-Hopkins  calculation, which is a validated novel method providing  better accuracy than the Friedewald equation in the  estimation of LDL-C.  Cresenciano Genre et al. Annamaria Helling. 6256;389(37): 2061-2068  (http://education.[facility].com/faq/FAQ164)    Total CHOL/HDL Ratio 4.0 <5.0 (calc)   Non-HDL Cholesterol (Calc) 139 (H) <130 mg/dL (calc)    Comment: For patients with diabetes plus 1 major ASCVD risk  factor, treating to a non-HDL-C goal of <100 mg/dL  (LDL-C of <70 mg/dL) is considered a therapeutic  option.   Stool Culture     Status: None   Collection Time: 10/07/19 12:03 PM   Specimen: Stool  Result Value Ref Range   MICRO NUMBER: [id number]    SPECIMEN QUALITY: Adequate    SOURCE: NOT GIVEN    STATUS: FINAL    SHIGA RESULT: Not Detected     Comment: Reference Range:Not Detected   MICRO NUMBER: [id number]    SPECIMEN  QUALITY: Adequate    Source NOT GIVEN    STATUS: FINAL    CAM RESULT: No enteric Campylobacter isolated    MICRO NUMBER: [id number]    SPECIMEN QUALITY: Adequate    SOURCE: NOT GIVEN    STATUS: FINAL    SS RESULT: No Salmonella or Shigella isolated   Ova and parasite examination     Status: None   Collection Time: 10/07/19 12:03 PM   Specimen: Stool  Result Value Ref Range   MICRO NUMBER: [id number]    SPECIMEN QUALITY: Adequate    Source NOT GIVEN    STATUS: FINAL    CONCENTRATE RESULT: No ova or parasites seen    TRICHROME RESULT: No ova or parasites seen    COMMENT:      Routine Ova and Parasite exam may not detect some parasites that occasionally cause diarrheal illness. Test code(s) 07218 (Cryptosporidium Ag., DFA) and/or 10018 (Cyclospora and Isospora Exam) may be ordered to detect these parasites. One  negative sample  does not necessarily rule out the presence of a parasitic infection.  For additional information, please refer to https://education.[facility].com/faq/FAQ203 (This link is being provided for informational/ educational purposes only.)   Clostridium difficile Toxin B, Qualitative, Real-Time PCR([facility])     Status: None   Collection Time: 10/07/19 12:03 PM  Result Value Ref Range   Toxigenic C. Difficile by PCR NOT DETECTED NOT DETECT    Comment: . This test is for use only with liquid or soft stools;  performance characteristics of other clinical specimen  types have not been established. . This assay was performed by Cepheid GeneXpert(R) PCR. The performance characteristics of this assay have been determined by [employer]. Performance characteristics refer to the analytical performance of the test. . For additional information, please refer to http://education.[facility].com/faq/FAQ136 (This link is being provided for informational/educational purposes only.) .     Assessment/Plan: 1. Hypertension associated with diabetes ([place]) Blood pressure much improved today.  Patient asymptomatic.  We will continue current regimen.  We will recheck CBC and CMP levels today. - CBC w/Diff - Comp Met (CMET)   This visit occurred during the SARS-CoV-2 public health emergency.  Safety protocols were in place, including screening questions prior to the visit, additional usage of staff PPE, and extensive cleaning of exam room while observing appropriate contact time as indicated for disinfecting solutions.    Leeanne Rio, PA-C

## 2019-11-11 ENCOUNTER — Other Ambulatory Visit: Payer: Self-pay | Admitting: Emergency Medicine

## 2019-11-11 DIAGNOSIS — D72829 Elevated white blood cell count, unspecified: Secondary | ICD-10-CM

## 2019-11-11 NOTE — Telephone Encounter (Signed)
Update --patient has become more interested in taking care of his chronic health issues, and tending appointments as directed and following directions.  Has also agreed to referral back to GI for chronic medical issues which has been placed.  As such we will cancel any plans of potential dismissal at this time.

## 2019-11-15 NOTE — Telephone Encounter (Signed)
Faxed the orders back to [facility].

## 2019-11-17 ENCOUNTER — Telehealth: Payer: Self-pay | Admitting: Physician Assistant

## 2019-11-17 NOTE — Telephone Encounter (Signed)
FYI

## 2019-11-17 NOTE — Telephone Encounter (Signed)
Joelene Millin a nurse with home care called in stating that the pt wanted her to make [facility] aware that he has stopped taking the Novolin R and has only been taking the Novolin N and that seems to be working well for him.

## 2019-11-21 ENCOUNTER — Ambulatory Visit (INDEPENDENT_AMBULATORY_CARE_PROVIDER_SITE_OTHER): Payer: Medicare Other

## 2019-11-21 ENCOUNTER — Other Ambulatory Visit: Payer: Self-pay

## 2019-11-21 DIAGNOSIS — D72829 Elevated white blood cell count, unspecified: Secondary | ICD-10-CM

## 2019-11-21 LAB — CBC WITH DIFFERENTIAL/PLATELET
Basophils Absolute: 0.1 10*3/uL (ref 0.0–0.1)
Basophils Relative: 0.9 % (ref 0.0–3.0)
Eosinophils Absolute: 0.2 10*3/uL (ref 0.0–0.7)
Eosinophils Relative: 2 % (ref 0.0–5.0)
HCT: 41.3 % (ref 39.0–52.0)
Hemoglobin: 14 g/dL (ref 13.0–17.0)
Lymphocytes Relative: 15.1 % (ref 12.0–46.0)
Lymphs Abs: 1.4 10*3/uL (ref 0.7–4.0)
MCHC: 33.8 g/dL (ref 30.0–36.0)
MCV: 97.9 fl (ref 78.0–100.0)
Monocytes Absolute: 0.7 10*3/uL (ref 0.1–1.0)
Monocytes Relative: 7.8 % (ref 3.0–12.0)
Neutro Abs: 7 10*3/uL (ref 1.4–7.7)
Neutrophils Relative %: 74.2 % (ref 43.0–77.0)
Platelets: 357 10*3/uL (ref 150.0–400.0)
RBC: 4.22 Mil/uL (ref 4.22–5.81)
RDW: 14.1 % (ref 11.5–15.5)
WBC: 9.4 10*3/uL (ref 4.0–10.5)

## 2019-11-22 ENCOUNTER — Telehealth: Payer: Self-pay | Admitting: Physician Assistant

## 2019-11-22 NOTE — Telephone Encounter (Signed)
I have placed home health order in the bin up front with a change sheet from [place].   Please advise

## 2019-11-22 NOTE — Telephone Encounter (Signed)
Home health paperwork in your folder for review

## 2019-11-23 NOTE — Telephone Encounter (Signed)
Reviewed. Paperwork given to Supervising MD for signature. Will fax once signed.

## 2019-11-29 ENCOUNTER — Encounter: Payer: Self-pay | Admitting: Physician Assistant

## 2019-11-29 NOTE — Telephone Encounter (Signed)
Can you check on status of this and follow-up with patient. In referral notes says they were getting records from previous GI -- Dr. Benson Norway -- but that note was from almost a month ago.

## 2019-12-07 ENCOUNTER — Encounter: Payer: Self-pay | Admitting: Gastroenterology

## 2019-12-17 ENCOUNTER — Other Ambulatory Visit: Payer: Self-pay | Admitting: Family Medicine

## 2019-12-20 ENCOUNTER — Encounter: Payer: Self-pay | Admitting: Physician Assistant

## 2019-12-21 ENCOUNTER — Other Ambulatory Visit: Payer: Self-pay | Admitting: Emergency Medicine

## 2019-12-21 MED ORDER — TRAZODONE HCL 100 MG PO TABS: 100.0000 mg | ORAL_TABLET | Freq: Every day | ORAL | 1 refills | Status: DC

## 2019-12-26 ENCOUNTER — Encounter: Payer: Self-pay | Admitting: Physician Assistant

## 2019-12-26 ENCOUNTER — Ambulatory Visit (INDEPENDENT_AMBULATORY_CARE_PROVIDER_SITE_OTHER): Payer: Medicare Other | Admitting: Physician Assistant

## 2019-12-26 ENCOUNTER — Ambulatory Visit (INDEPENDENT_AMBULATORY_CARE_PROVIDER_SITE_OTHER): Payer: Medicare Other

## 2019-12-26 ENCOUNTER — Other Ambulatory Visit: Payer: Self-pay

## 2019-12-26 VITALS — BP 110/80 | HR 94 | Temp 98.2°F | Resp 16 | Ht 66.0 in | Wt 210.0 lb

## 2019-12-26 DIAGNOSIS — E1159 Type 2 diabetes mellitus with other circulatory complications: Secondary | ICD-10-CM

## 2019-12-26 DIAGNOSIS — Z794 Long term (current) use of insulin: Secondary | ICD-10-CM | POA: Diagnosis not present

## 2019-12-26 DIAGNOSIS — N182 Chronic kidney disease, stage 2 (mild): Secondary | ICD-10-CM | POA: Diagnosis not present

## 2019-12-26 DIAGNOSIS — Z0000 Encounter for general adult medical examination without abnormal findings: Secondary | ICD-10-CM

## 2019-12-26 DIAGNOSIS — Z23 Encounter for immunization: Secondary | ICD-10-CM

## 2019-12-26 DIAGNOSIS — E1169 Type 2 diabetes mellitus with other specified complication: Secondary | ICD-10-CM | POA: Diagnosis not present

## 2019-12-26 DIAGNOSIS — E785 Hyperlipidemia, unspecified: Secondary | ICD-10-CM

## 2019-12-26 DIAGNOSIS — I10 Essential (primary) hypertension: Secondary | ICD-10-CM

## 2019-12-26 DIAGNOSIS — E1122 Type 2 diabetes mellitus with diabetic chronic kidney disease: Secondary | ICD-10-CM | POA: Diagnosis not present

## 2019-12-26 LAB — COMPREHENSIVE METABOLIC PANEL
ALT: 12 U/L (ref 0–53)
AST: 15 U/L (ref 0–37)
Albumin: 3.6 g/dL (ref 3.5–5.2)
Alkaline Phosphatase: 96 U/L (ref 39–117)
BUN: 26 mg/dL — ABNORMAL HIGH (ref 6–23)
CO2: 28 mEq/L (ref 19–32)
Calcium: 9.3 mg/dL (ref 8.4–10.5)
Chloride: 96 mEq/L (ref 96–112)
Creatinine, Ser: 0.78 mg/dL (ref 0.40–1.50)
GFR: 97.39 mL/min (ref >60.00)
Glucose, Bld: 200 mg/dL — ABNORMAL HIGH (ref 70–99)
Potassium: 3.9 mEq/L (ref 3.5–5.1)
Sodium: 135 mEq/L (ref 135–145)
Total Bilirubin: 0.6 mg/dL (ref 0.2–1.2)
Total Protein: 7.5 g/dL (ref 6.0–8.3)

## 2019-12-26 LAB — LIPID PANEL
Cholesterol: 194 mg/dL (ref 0–200)
HDL: 49 mg/dL (ref >39.00)
LDL Cholesterol: 116 mg/dL — ABNORMAL HIGH (ref 0–99)
NonHDL: 145.49
Total CHOL/HDL Ratio: 4
Triglycerides: 145 mg/dL (ref 0.0–149.0)
VLDL: 29 mg/dL (ref 0.0–40.0)

## 2019-12-26 LAB — HEMOGLOBIN A1C: Hgb A1c MFr Bld: 6.7 % — ABNORMAL HIGH (ref 4.6–6.5)

## 2019-12-26 NOTE — Progress Notes (Signed)
History of Present Illness: Patient is a 74 y.o. male who presents to clinic today for follow-up of Diabetes Mellitus II, uncontrolled with peripheral neuropathy.  Patient currently on medication regimen of Novolin N 35 units daily and Novolin R following sliding scale.  Endorses taking medications as directed. Endorses watching diet and overall limiting carb.  Denies change to vision, numbness or tingling of hands and feet. Notes good urinary output. Endorses checking blood glucose as directed. Fasting glucose averaging 140s. .   Latest Maintenance: A1C --  Lab Results  Component Value Date   HGBA1C 11.1 (H) 09/23/2019   Diabetic Eye Exam -- Appt scheduled 01/05/20 Urine Microalbumin -- On ARB daily. BP stable.  Foot Exam -- Due. Denies concerns today.    Past Medical History:  Diagnosis Date  . Arthritis   . Depression   . Diabetes mellitus without complication ([place])   . Hyperlipidemia   . Hypertension   . Osteoporosis   . Sleep apnea     Current Outpatient Medications on File Prior to Visit  Medication Sig Dispense Refill  . allopurinol (ZYLOPRIM) 300 MG tablet Take 1 tablet (300 mg total) by mouth daily. 90 tablet 0  . chlorthalidone (HYGROTON) 25 MG tablet Take 1 tablet (25 mg total) by mouth daily. 90 tablet 0  . Continuous Blood Gluc Receiver (FREESTYLE LIBRE 14 DAY READER) DEVI Apply reader for monitoring of continuous glucose monitoring 1 each 11  . Continuous Blood Gluc Sensor (FREESTYLE LIBRE 14 DAY SENSOR) MISC Apply sensor every 14 days for continuous blood glucose monitoring 1 each 11  . escitalopram (LEXAPRO) 20 MG tablet Take 1 tablet (20 mg total) by mouth daily. 90 tablet 0  . insulin NPH Human (NOVOLIN N) 100 UNIT/ML injection Inject into the skin. 35U daily    . insulin regular (NOVOLIN R) 100 units/mL injection Inject into the skin 3 (three) times daily before meals.    Marland Kitchen loperamide (IMODIUM A-D) 2 MG tablet Take 2 mg by mouth as needed for diarrhea or loose  stools.    Marland Kitchen losartan (COZAAR) 50 MG tablet Take 1 tablet (50 mg total) by mouth daily. 90 tablet 0  . metoprolol tartrate (LOPRESSOR) 50 MG tablet TAKE 1&1/2 TABLETS BY MOUTH TWICE A DAY 270 tablet 1  . traMADol (ULTRAM) 50 MG tablet Take 50 mg by mouth every 6 (six) hours as needed.    . traZODone (DESYREL) 100 MG tablet Take 1 tablet (100 mg total) by mouth at bedtime. 90 tablet 1   No current facility-administered medications on file prior to visit.    Allergies  Allergen Reactions  . Lisinopril Other (See Comments) and Swelling    Family History  Problem Relation Age of Onset  . Arthritis Mother   . COPD Mother   . Early death Mother   . Stroke Father   . Arthritis Brother   . Stroke Brother     Social History   Socioeconomic History  . Marital status: Married    Spouse name: [place]  . Number of children: Not on file  . Years of education: Not on file  . Highest education level: Not on file  Occupational History  . Occupation: retired  Tobacco Use  . Smoking status: Never Smoker  . Smokeless tobacco: Never Used  Vaping Use  . Vaping Use: Never used  Substance and Sexual Activity  . Alcohol use: Yes    Comment: occasionally  . Drug use: Not Currently  . Sexual activity: Not  Currently  Other Topics Concern  . Not on file  Social History Narrative  . Not on file   Social Determinants of Health   Financial Resource Strain: Low Risk   . Difficulty of Paying Living Expenses: Not hard at all  Food Insecurity: No Food Insecurity  . Worried About Charity fundraiser in the Last Year: Never true  . Ran Out of Food in the Last Year: Never true  Transportation Needs: No Transportation Needs  . Lack of Transportation (Medical): No  . Lack of Transportation (Non-Medical): No  Physical Activity: Inactive  . Days of Exercise per Week: 0 days  . Minutes of Exercise per Session: 0 min  Stress: No Stress Concern Present  . Feeling of Stress : Not at all  Social  Connections: Moderately Isolated  . Frequency of Communication with Friends and Family: More than three times a week  . Frequency of Social Gatherings with Friends and Family: Once a week  . Attends Religious Services: Never  . Active Member of Clubs or Organizations: No  . Attends Archivist Meetings: Never  . Marital Status: Married   Review of Systems: Pertinent ROS are listed in HPI  Physical Examination: BP 110/80   Pulse 94   Temp 98.2 F (36.8 C) (Temporal)   Resp 16   Ht 5\' 6"  (1.676 m)   Wt 210 lb (95.3 kg)   SpO2 97%   BMI 33.89 kg/m  General appearance: alert, cooperative, appears stated age and no distress Lungs: clear to auscultation bilaterally Heart: regular rate and rhythm, S1, S2 normal, no murmur, click, rub or gallop Extremities: no edema, redness or tenderness in the calves or thighs Pulses: 2+ and symmetric Neurologic: Grossly normal  Diabetic Foot Form - Detailed   Diabetic Foot Exam - detailed Diabetic Foot exam was performed with the following findings: Yes 12/26/2019 12:59 PM  Visual Foot Exam completed.: Yes  Can the patient see the bottom of their feet?: No Are the shoes appropriate in style and fit?: Yes Is there swelling or and abnormal foot shape?: No Is there a claw toe deformity?: No Is there elevated skin temparature?: No Is there foot or ankle muscle weakness?: No Normal Range of Motion: Yes Right posterior Tibialias: Present Left posterior Tibialias: Present  Right Dorsalis Pedis: Present Left Dorsalis Pedis: Present  Semmes-Weinstein Monofilament Test R Site 1-Great Toe: Pos L Site 1-Great Toe: Pos       Assessment/Plan: 1. Type 2 diabetes mellitus with stage 2 chronic kidney disease, with long-term current use of insulin ([facility]) Fasting glucose improved. Foot exam updated today. Eye exam scheduled for next week. Repeat fasting labs today. Will alter regimen according to results.  - Comprehensive metabolic panel -  Hemoglobin A1c - Lipid panel  2. Hypertension associated with diabetes ([place]) BP normotensive. Asymptomatic. Continue current regimen. Repeat labs today. - Comprehensive metabolic panel  3. Hyperlipidemia associated with type 2 diabetes mellitus ([place]) Repeat fasting lipids today. Continue statin. - Lipid panel  4. Need for pneumococcal vaccination Due for pneumovax booster. Updated today. - Pneumococcal polysaccharide vaccine 23-valent greater than or equal to 2yo subcutaneous/IM  This visit occurred during the SARS-CoV-2 public health emergency.  Safety protocols were in place, including screening questions prior to the visit, additional usage of staff PPE, and extensive cleaning of exam room while observing appropriate contact time as indicated for disinfecting solutions.

## 2019-12-26 NOTE — Patient Instructions (Signed)
Kevin Daniel , Thank you for taking time to come for your Medicare Wellness Visit. I appreciate your ongoing commitment to your health goals. Please review the following plan we discussed and let me know if I can assist you in the future.   Screening recommendations/referrals: Colonoscopy: Appointment already scheduled with GI. Recommended yearly ophthalmology/optometry visit for glaucoma screening and checkup Recommended yearly dental visit for hygiene and checkup  Vaccinations: Influenza vaccine: Due 01/2020 Pneumococcal vaccine: Vaccine given today Tdap vaccine: Up to Date-Due 05/06/2023 Shingles vaccine: 1 dose of Shingrix completed 05/06/2019-Discuss 2nd dose with pharmacy if not completed. Covid-19: Completed vaccines  Advanced directives: Please bring a copy to your next office visit.  Conditions/risks identified: See problem list  Next appointment: Follow up in one year for your annual wellness visit. 12/31/20 @ 10:30  Preventive Care 65 Years and Older, Male Preventive care refers to lifestyle choices and visits with your health care provider that can promote health and wellness. What does preventive care include?  A yearly physical exam. This is also called an annual well check.  Dental exams once or twice a year.  Routine eye exams. Ask your health care provider how often you should have your eyes checked.  Personal lifestyle choices, including:  Daily care of your teeth and gums.  Regular physical activity.  Eating a healthy diet.  Avoiding tobacco and drug use.  Limiting alcohol use.  Practicing safe sex.  Taking low doses of aspirin every day.  Taking vitamin and mineral supplements as recommended by your health care provider. What happens during an annual well check? The services and screenings done by your health care provider during your annual well check will depend on your age, overall health, lifestyle risk factors, and family history of  disease. Counseling  Your health care provider may ask you questions about your:  Alcohol use.  Tobacco use.  Drug use.  Emotional well-being.  Home and relationship well-being.  Sexual activity.  Eating habits.  History of falls.  Memory and ability to understand (cognition).  Work and work Statistician. Screening  You may have the following tests or measurements:  Height, weight, and BMI.  Blood pressure.  Lipid and cholesterol levels. These may be checked every 5 years, or more frequently if you are over 73 years old.  Skin check.  Lung cancer screening. You may have this screening every year starting at age 54 if you have a 30-pack-year history of smoking and currently smoke or have quit within the past 15 years.  Fecal occult blood test (FOBT) of the stool. You may have this test every year starting at age 76.  Flexible sigmoidoscopy or colonoscopy. You may have a sigmoidoscopy every 5 years or a colonoscopy every 10 years starting at age 21.  Prostate cancer screening. Recommendations will vary depending on your family history and other risks.  Hepatitis C blood test.  Hepatitis B blood test.  Sexually transmitted disease (STD) testing.  Diabetes screening. This is done by checking your blood sugar (glucose) after you have not eaten for a while (fasting). You may have this done every 1-3 years.  Abdominal aortic aneurysm (AAA) screening. You may need this if you are a current or former smoker.  Osteoporosis. You may be screened starting at age 69 if you are at high risk. Talk with your health care provider about your test results, treatment options, and if necessary, the need for more tests. Vaccines  Your health care provider may recommend certain vaccines, such  as:  Influenza vaccine. This is recommended every year.  Tetanus, diphtheria, and acellular pertussis (Tdap, Td) vaccine. You may need a Td booster every 10 years.  Zoster vaccine. You may  need this after age 21.  Pneumococcal 13-valent conjugate (PCV13) vaccine. One dose is recommended after age 50.  Pneumococcal polysaccharide (PPSV23) vaccine. One dose is recommended after age 45. Talk to your health care provider about which screenings and vaccines you need and how often you need them. This information is not intended to replace advice given to you by your health care provider. Make sure you discuss any questions you have with your health care provider. Document Released: 05/18/2015 Document Revised: 01/09/2016 Document Reviewed: 02/20/2015 Elsevier Interactive Patient Education  2017 [place] Prevention in the Home Falls can cause injuries. They can happen to people of all ages. There are many things you can do to make your home safe and to help prevent falls. What can I do on the outside of my home?  Regularly fix the edges of walkways and driveways and fix any cracks.  Remove anything that might make you trip as you walk through a door, such as a raised step or threshold.  Trim any bushes or trees on the path to your home.  Use bright outdoor lighting.  Clear any walking paths of anything that might make someone trip, such as rocks or tools.  Regularly check to see if handrails are loose or broken. Make sure that both sides of any steps have handrails.  Any raised decks and porches should have guardrails on the edges.  Have any leaves, snow, or ice cleared regularly.  Use sand or salt on walking paths during winter.  Clean up any spills in your garage right away. This includes oil or grease spills. What can I do in the bathroom?  Use night lights.  Install grab bars by the toilet and in the tub and shower. Do not use towel bars as grab bars.  Use non-skid mats or decals in the tub or shower.  If you need to sit down in the shower, use a plastic, non-slip stool.  Keep the floor dry. Clean up any water that spills on the floor as soon as it  happens.  Remove soap buildup in the tub or shower regularly.  Attach bath mats securely with double-sided non-slip rug tape.  Do not have throw rugs and other things on the floor that can make you trip. What can I do in the bedroom?  Use night lights.  Make sure that you have a light by your bed that is easy to reach.  Do not use any sheets or blankets that are too big for your bed. They should not hang down onto the floor.  Have a firm chair that has side arms. You can use this for support while you get dressed.  Do not have throw rugs and other things on the floor that can make you trip. What can I do in the kitchen?  Clean up any spills right away.  Avoid walking on wet floors.  Keep items that you use a lot in easy-to-reach places.  If you need to reach something above you, use a strong step stool that has a grab bar.  Keep electrical cords out of the way.  Do not use floor polish or wax that makes floors slippery. If you must use wax, use non-skid floor wax.  Do not have throw rugs and other things on the  floor that can make you trip. What can I do with my stairs?  Do not leave any items on the stairs.  Make sure that there are handrails on both sides of the stairs and use them. Fix handrails that are broken or loose. Make sure that handrails are as long as the stairways.  Check any carpeting to make sure that it is firmly attached to the stairs. Fix any carpet that is loose or worn.  Avoid having throw rugs at the top or bottom of the stairs. If you do have throw rugs, attach them to the floor with carpet tape.  Make sure that you have a light switch at the top of the stairs and the bottom of the stairs. If you do not have them, ask someone to add them for you. What else can I do to help prevent falls?  Wear shoes that:  Do not have high heels.  Have rubber bottoms.  Are comfortable and fit you well.  Are closed at the toe. Do not wear sandals.  If you  use a stepladder:  Make sure that it is fully opened. Do not climb a closed stepladder.  Make sure that both sides of the stepladder are locked into place.  Ask someone to hold it for you, if possible.  Clearly mark and make sure that you can see:  Any grab bars or handrails.  First and last steps.  Where the edge of each step is.  Use tools that help you move around (mobility aids) if they are needed. These include:  Canes.  Walkers.  Scooters.  Crutches.  Turn on the lights when you go into a dark area. Replace any light bulbs as soon as they burn out.  Set up your furniture so you have a clear path. Avoid moving your furniture around.  If any of your floors are uneven, fix them.  If there are any pets around you, be aware of where they are.  Review your medicines with your doctor. Some medicines can make you feel dizzy. This can increase your chance of falling. Ask your doctor what other things that you can do to help prevent falls. This information is not intended to replace advice given to you by your health care provider. Make sure you discuss any questions you have with your health care provider. Document Released: 02/15/2009 Document Revised: 09/27/2015 Document Reviewed: 05/26/2014 Elsevier Interactive Patient Education  2017 [employer].

## 2019-12-26 NOTE — Progress Notes (Signed)
Subjective:   Kevin Daniel is a 74 y.o. male who presents for an Initial Medicare Annual Wellness Visit.  Review of Systems   Cardiac Risk Factors include: advanced age (>65men, >36 women);hypertension;dyslipidemia;diabetes mellitus;obesity (BMI >30kg/m2);sedentary lifestyle;male gender    Objective:    Today's Vitals   12/26/19 1117  BP: 110/80  Pulse: 94  Resp: 16  Temp: 98.2 F (36.8 C)  SpO2: 97%  Weight: 210 lb (95.3 kg)  Height: 5\' 6"  (1.676 m)   Body mass index is 33.89 kg/m.  Advanced Directives 12/26/2019  Does Patient Have a Medical Advance Directive? Yes  Type of Paramedic of [place];Living will  Copy of [place] in Chart? No - copy requested    Current Medications (verified) Outpatient Encounter Medications as of 12/26/2019  Medication Sig  . allopurinol (ZYLOPRIM) 300 MG tablet Take 1 tablet (300 mg total) by mouth daily.  . chlorthalidone (HYGROTON) 25 MG tablet Take 1 tablet (25 mg total) by mouth daily.  . Continuous Blood Gluc Receiver (FREESTYLE LIBRE 14 DAY READER) DEVI Apply reader for monitoring of continuous glucose monitoring  . Continuous Blood Gluc Sensor (FREESTYLE LIBRE 14 DAY SENSOR) MISC Apply sensor every 14 days for continuous blood glucose monitoring  . escitalopram (LEXAPRO) 20 MG tablet Take 1 tablet (20 mg total) by mouth daily.  . insulin NPH Human (NOVOLIN N) 100 UNIT/ML injection Inject into the skin. 35U daily  . insulin regular (NOVOLIN R) 100 units/mL injection Inject into the skin 3 (three) times daily before meals.  Marland Kitchen loperamide (IMODIUM A-D) 2 MG tablet Take 2 mg by mouth as needed for diarrhea or loose stools.  Marland Kitchen losartan (COZAAR) 50 MG tablet Take 1 tablet (50 mg total) by mouth daily.  . metoprolol tartrate (LOPRESSOR) 50 MG tablet TAKE 1&1/2 TABLETS BY MOUTH TWICE A DAY  . traMADol (ULTRAM) 50 MG tablet Take 50 mg by mouth every 6 (six) hours as needed.  . traZODone  (DESYREL) 100 MG tablet Take 1 tablet (100 mg total) by mouth at bedtime.   No facility-administered encounter medications on file as of 12/26/2019.    Allergies (verified) Lisinopril   History: Past Medical History:  Diagnosis Date  . Arthritis   . Depression   . Diabetes mellitus without complication ([place])   . Hyperlipidemia   . Hypertension   . Osteoporosis   . Sleep apnea    Past Surgical History:  Procedure Laterality Date  . ANKLE FRACTURE SURGERY Left 2020  . APPENDECTOMY  1964   Family History  Problem Relation Age of Onset  . Arthritis Mother   . COPD Mother   . Early death Mother   . Stroke Father   . Arthritis Brother   . Stroke Brother    Social History   Socioeconomic History  . Marital status: Married    Spouse name: [place]  . Number of children: Not on file  . Years of education: Not on file  . Highest education level: Not on file  Occupational History  . Occupation: retired  Tobacco Use  . Smoking status: Never Smoker  . Smokeless tobacco: Never Used  Vaping Use  . Vaping Use: Never used  Substance and Sexual Activity  . Alcohol use: Yes    Comment: occasionally  . Drug use: Not Currently  . Sexual activity: Not Currently  Other Topics Concern  . Not on file  Social History Narrative  . Not on file   Social Determinants  of Health   Financial Resource Strain: Low Risk   . Difficulty of Paying Living Expenses: Not hard at all  Food Insecurity: No Food Insecurity  . Worried About Charity fundraiser in the Last Year: Never true  . Ran Out of Food in the Last Year: Never true  Transportation Needs: No Transportation Needs  . Lack of Transportation (Medical): No  . Lack of Transportation (Non-Medical): No  Physical Activity: Inactive  . Days of Exercise per Week: 0 days  . Minutes of Exercise per Session: 0 min  Stress: No Stress Concern Present  . Feeling of Stress : Not at all  Social Connections: Moderately Isolated  . Frequency  of Communication with Friends and Family: More than three times a week  . Frequency of Social Gatherings with Friends and Family: Once a week  . Attends Religious Services: Never  . Active Member of Clubs or Organizations: No  . Attends Archivist Meetings: Never  . Marital Status: Married   Tobacco Counseling Counseling given: Not Answered   Clinical Intake:  Pre-visit preparation completed: Yes  Pain : No/denies pain     Nutritional Status: BMI > 30  Obese Nutritional Risks: None Diabetes: Yes CBG done?: No Did pt. bring in CBG monitor from home?: No  How often do you need to have someone help you when you read instructions, pamphlets, or other written materials from your doctor or pharmacy?: 1 - Never What is the last grade level you completed in school?: associate's degree  Nutrition Risk Assessment:  Has the patient had any N/V/D within the last 2 months?  Yes Diarrhea-Appt already scheduled with GI Does the patient have any non-healing wounds?  No  Has the patient had any unintentional weight loss or weight gain?  No   Diabetes:  Is the patient diabetic?  Yes  If diabetic, was a CBG obtained today?  Yes  Did the patient bring in their glucometer from home?  No  How often do you monitor your CBG's? Several times per day.   Financial Strains and Diabetes Management:  Are you having any financial strains with the device, your supplies or your medication? No .  Does the patient want to be seen by Chronic Care Management for management of their diabetes?  No  Would the patient like to be referred to a Nutritionist or for Diabetic Management?  No   Diabetic Exams:  Diabetic Eye Exam: Completed 2019. Overdue for diabetic eye exam. Pt has been advised about the importance in completing this exam. Patient has an appt scheduled for Sept.  Diabetic Foot Exam:Pt has been advised about the importance in completing this exam.  PCP to complete  Interpreter  Needed?: No  Information entered by :: Caroleen Hamman LPN  Activities of Daily Living In your present state of health, do you have any difficulty performing the following activities: 12/26/2019 09/23/2019  Hearing? N N  Vision? N N  Difficulty concentrating or making decisions? N Y  Walking or climbing stairs? - Y  Dressing or bathing? N N  Doing errands, shopping? N Y  Conservation officer, nature and eating ? N -  Using the Toilet? N -  In the past six months, have you accidently leaked urine? N -  Do you have problems with loss of bowel control? N -  Managing your Medications? N -  Managing your Finances? N -  Housekeeping or managing your Housekeeping? N -     Immunizations and Health Maintenance  Immunization History  Administered Date(s) Administered  . Influenza-Unspecified 03/06/2019  . PFIZER SARS-COV-2 Vaccination 06/30/2019, 07/27/2019  . Pneumococcal Polysaccharide-23 12/26/2019  . Tdap 05/05/2013  . Zoster Recombinat (Shingrix) 05/06/2019   Health Maintenance Due  Topic Date Due  . Hepatitis C Screening  Never done  . FOOT EXAM  Never done  . OPHTHALMOLOGY EXAM  Never done  . COLONOSCOPY  05/05/2013  . INFLUENZA VACCINE  12/04/2019    Patient Care Team: Delorse Limber as PCP - General (Family Medicine)  Indicate any recent Medical Services you may have received from other than [facility] providers in the past year (date may be approximate).    Assessment:   This is a routine wellness examination for [place].  Hearing/Vision screen  Hearing Screening   125Hz  250Hz  500Hz  1000Hz  2000Hz  3000Hz  4000Hz  6000Hz  8000Hz   Right ear:           Left ear:           Comments: No issues  Vision Screening Comments: Wears glasses Last eye exam-2019   Dietary issues and exercise activities discussed: Current Exercise Habits: The patient does not participate in regular exercise at present, Exercise limited by: None identified  Goals Addressed            This Visit's  Progress   . Patient Stated       Maintain or improve current health      Depression Screen PHQ 2/9 Scores 12/26/2019  PHQ - 2 Score 0    Fall Risk Fall Risk  12/26/2019 09/23/2019  Falls in the past year? 1 1  Number falls in past yr: 1 1  Injury with Fall? 0 1  Risk for fall due to : History of fall(s) History of fall(s);Impaired balance/gait  Follow up Falls prevention discussed Falls evaluation completed    FALL RISK PREVENTION PERTAINING TO THE HOME:  Any stairs in or around the home? Yes  If so, are there any without handrails? No   Home free of loose throw rugs in walkways, pet beds, electrical cords, etc? No  Adequate lighting in your home to reduce risk of falls? Yes   ASSISTIVE DEVICES UTILIZED TO PREVENT FALLS:  Life alert? No  Use of a cane, walker or w/c? No  Grab bars in the bathroom? Yes  Shower chair or bench in shower? No  Elevated toilet seat or a handicapped toilet? No    TIMED UP AND GO:  Was the test performed? Yes .  Length of time to ambulate 10 feet: 11 sec.   GAIT:  Appearance of gait:  OR Gait slow and steady without the use of an assistive device.  Education: Fall risk prevention has been discussed.  Intervention(s) required? No   DME/home health order needed?  No    Cognitive Function: No cognitive impairment noted.     6CIT Screen 12/26/2019  What Year? 0 points  What month? 0 points  What time? 0 points  Count back from 20 0 points  Months in reverse 0 points  Repeat phrase 0 points  Total Score 0    Screening Tests Health Maintenance  Topic Date Due  . Hepatitis C Screening  Never done  . FOOT EXAM  Never done  . OPHTHALMOLOGY EXAM  Never done  . COLONOSCOPY  05/05/2013  . INFLUENZA VACCINE  12/04/2019  . DTAP VACCINES (1) 07/17/2078 (Originally 07/01/1946)  . HEMOGLOBIN A1C  03/25/2020  . PNA vac Low Risk Adult (2 of 2 -  PCV13) 12/25/2020  . DTaP/Tdap/Td (2 - Td or Tdap) 05/06/2023  . TETANUS/TDAP  05/06/2023  .  COVID-19 Vaccine  Completed    Qualifies for Shingles Vaccine? No   Tdap: Up to Date  Flu Vaccine: Due 01/2020  Pneumococcal Vaccine: Vaccine completed today  Covid-19: information provided/ Completed vaccines   Cancer Screenings:  Colorectal Screening: Patient already has an appt scheduled with GI  Lung Cancer Screening: (Low Dose CT Chest recommended if Age 53-80 years, 30 pack-year currently smoking OR have quit w/in 15years.) does not qualify.    Additional Screening:  Hepatitis C Screening: does qualify; Discuss with PCP  Vision Screening: Recommended annual ophthalmology exams for early detection of glaucoma and other disorders of the eye. Is the patient up to date with their annual eye exam?  No  Who is the provider or what is the name of the office in which the pt attends annual eye exams? [facility]   Dental Screening: Recommended annual dental exams for proper oral hygiene  Community Resource Referral:  CRR required this visit?  No        Plan:  I have personally reviewed and addressed the Medicare Annual Wellness questionnaire and have noted the following in the patient's chart:  A. Medical and social history B. Use of alcohol, tobacco or illicit drugs  C. Current medications and supplements D. Functional ability and status E.  Nutritional status F.  Physical activity G. Advance directives H. List of other physicians I.  Hospitalizations, surgeries, and ER visits in previous 12 months J.  [place] such as hearing and vision if needed, cognitive and depression L. Referrals and appointments   In addition, I have reviewed and discussed with patient certain preventive protocols, quality metrics, and best practice recommendations. A written personalized care plan for preventive services as well as general preventive health recommendations were provided to patient.  Patient to access AVS via mychart.   Signed,    Marta Antu,  LPN   [id number]  Nurse Health Advisor    Nurse Notes: None

## 2019-12-26 NOTE — Patient Instructions (Addendum)
Please go to the lab today for blood work.  I will call you with your results. We will alter treatment regimen(s) if indicated by your results.   For the Novolin R, cut back on sliding scale to a max of 18 units as needed for elevated mealtime glucose.   Continue current dose of Novolin N.  We will make further changes once results are in.

## 2019-12-27 ENCOUNTER — Other Ambulatory Visit: Payer: Self-pay

## 2019-12-27 DIAGNOSIS — E1169 Type 2 diabetes mellitus with other specified complication: Secondary | ICD-10-CM

## 2019-12-27 MED ORDER — ATORVASTATIN CALCIUM 20 MG PO TABS: 20.0000 mg | ORAL_TABLET | Freq: Every day | ORAL | 1 refills | Status: DC

## 2020-01-12 ENCOUNTER — Encounter: Payer: Self-pay | Admitting: Physician Assistant

## 2020-01-12 ENCOUNTER — Other Ambulatory Visit: Payer: Self-pay | Admitting: Emergency Medicine

## 2020-01-12 DIAGNOSIS — E1159 Type 2 diabetes mellitus with other circulatory complications: Secondary | ICD-10-CM

## 2020-01-12 MED ORDER — CHLORTHALIDONE 25 MG PO TABS: 25.0000 mg | ORAL_TABLET | Freq: Every day | ORAL | 1 refills | Status: DC

## 2020-01-13 ENCOUNTER — Encounter: Payer: Self-pay | Admitting: Physician Assistant

## 2020-01-16 MED ORDER — PRAVASTATIN SODIUM 20 MG PO TABS: 20.0000 mg | ORAL_TABLET | Freq: Every day | ORAL | 0 refills | Status: DC

## 2020-01-19 ENCOUNTER — Other Ambulatory Visit: Payer: Self-pay | Admitting: Physician Assistant

## 2020-01-19 NOTE — Telephone Encounter (Signed)
Is this patient suppose to be on Atorvastatin and Pravastatin for his cholesterol? Please advise

## 2020-01-20 ENCOUNTER — Ambulatory Visit (INDEPENDENT_AMBULATORY_CARE_PROVIDER_SITE_OTHER): Payer: Medicare Other | Admitting: Gastroenterology

## 2020-01-20 ENCOUNTER — Encounter: Payer: Self-pay | Admitting: Gastroenterology

## 2020-01-20 VITALS — BP 112/76 | HR 80 | Ht 66.0 in | Wt 215.0 lb

## 2020-01-20 DIAGNOSIS — K529 Noninfective gastroenteritis and colitis, unspecified: Secondary | ICD-10-CM

## 2020-01-20 MED ORDER — SUTAB 1479-225-188 MG PO TABS: 1.0000 | ORAL_TABLET | Freq: Once | ORAL | 0 refills | Status: AC

## 2020-01-20 NOTE — Progress Notes (Signed)
01/20/2020 Kevin Daniel [id number] 07-13-1945   HISTORY OF PRESENT ILLNESS: This is a 74 year old male who is new to our practice.  He is here today with his wife.  He is here today at the request of Elyn Aquas, PA-C, for evaluation regarding chronic diarrhea.  He tells me that he has had this diarrhea for 8 months, very likely even longer than that.  He says that historically diarrhea had not been an issue in the past.  He reports sometimes up to 10 bowel movements a day.  He reports a lot of urgency and incontinence and has been wearing Depends.  This has been very debilitating to him and really interfering with his life and the plans for traveling.  He denies seeing blood in his stool but does admit to seeing a lot of mucus.  He says that he actually saw Dr. Benson Norway a few months ago and he recommended discontinuing his metformin.  He did discontinue his Metformin and his atorvastatin.  He has noticed some mild improvement in symptoms, but no major change.  His last colonoscopy was over 10 years ago in [place], [place].  He says he is never had colon polyps, but does remember having diverticular disease.  Stool studies in June were unremarkable.  CBC, CMP, TSH were all unrevealing.  Occasionally he uses Imodium or Pepto-Bismol.   Past Medical History:  Diagnosis Date  . Arthritis   . Depression   . Diabetes mellitus without complication ([place])   . Hyperlipidemia   . Hypertension   . Osteoporosis   . Sleep apnea    Past Surgical History:  Procedure Laterality Date  . ANKLE FRACTURE SURGERY Left 2020  . APPENDECTOMY  1964  . CORONARY ARTERY BYPASS GRAFT  2010   at time of COX-MAZE procedure  . COX-MAZE MICROWAVE ABLATION  2010   for AFib- done at time of Bypass  . TOE AMPUTATION Left     reports that he has never smoked. He has never used smokeless tobacco. He reports current alcohol use. He reports previous drug use. family history includes Arthritis in his brother and  mother; COPD in his mother; Colon cancer in his father; Early death in his mother; Prostate cancer in his father; Stroke in his brother and father. Allergies  Allergen Reactions  . Lisinopril Other (See Comments) and Swelling      Outpatient Encounter Medications as of 01/20/2020  Medication Sig  . allopurinol (ZYLOPRIM) 300 MG tablet Take 1 tablet (300 mg total) by mouth daily.  . chlorthalidone (HYGROTON) 25 MG tablet Take 1 tablet (25 mg total) by mouth daily.  . Continuous Blood Gluc Receiver (FREESTYLE LIBRE 14 DAY READER) DEVI Apply reader for monitoring of continuous glucose monitoring  . Continuous Blood Gluc Sensor (FREESTYLE LIBRE 14 DAY SENSOR) MISC Apply sensor every 14 days for continuous blood glucose monitoring  . escitalopram (LEXAPRO) 20 MG tablet Take 1 tablet (20 mg total) by mouth daily.  . insulin NPH Human (NOVOLIN N) 100 UNIT/ML injection Inject into the skin. 35U daily  . insulin regular (NOVOLIN R) 100 units/mL injection Inject into the skin 3 (three) times daily before meals.  Marland Kitchen loperamide (IMODIUM A-D) 2 MG tablet Take 2 mg by mouth as needed for diarrhea or loose stools.  Marland Kitchen losartan (COZAAR) 50 MG tablet Take 1 tablet (50 mg total) by mouth daily.  . metoprolol tartrate (LOPRESSOR) 50 MG tablet TAKE 1&1/2 TABLETS BY MOUTH TWICE A DAY  . traMADol Veatrice Bourbon)  50 MG tablet Take 50 mg by mouth every 6 (six) hours as needed.  . traZODone (DESYREL) 100 MG tablet Take 1 tablet (100 mg total) by mouth at bedtime.  . pravastatin (PRAVACHOL) 20 MG tablet Take 1 tablet (20 mg total) by mouth daily. (Patient not taking: Reported on 01/20/2020)   No facility-administered encounter medications on file as of 01/20/2020.     REVIEW OF SYSTEMS  : All other systems reviewed and negative except where noted in the History of Present Illness.   PHYSICAL EXAM: BP 112/76   Pulse 80   Ht 5\' 6"  (1.676 m)   Wt 215 lb (97.5 kg)   BMI 34.70 kg/m  General: Well developed white male in  no acute distress Head: Normocephalic and atraumatic Eyes:  Sclerae anicteric, conjunctiva pink. Ears: Normal auditory acuity Lungs: Clear throughout to auscultation; no W/R/R. Heart: Regular rate and rhythm; no M/R/G. Abdomen: Soft, non-distended.  BS present.  Non-tender. Rectal:  Will be done at the time of colonoscopy. Musculoskeletal: Symmetrical with no gross deformities  Skin: No lesions on visible extremities Extremities: No edema  Neurological: Alert oriented x 4, grossly non-focal Psychological:  Alert and cooperative. Normal mood and affect  ASSESSMENT AND PLAN: *Chronic diarrhea for the last 8 months or more: Severely impacting his life.  Stool studies and basic lab studies unremarkable.  He discontinued metformin and atorvastatin with maybe mild improvement in symptoms.  Describes having sometimes up to 10 bowel movements a day with urgency and incontinence.  Last colonoscopy was over 10 years ago.  We will plan for colonoscopy with Dr. Rush Landmark to rule out IBD, microscopic colitis, etc.  The risks, benefits, and alternatives to colonoscopy were discussed with the patient and he consents to proceed.  Can use Imodium or Pepto-Bismol in the interim. *IDDM:  Insulin will be adjusted prior to endoscopic procedure per protocol. Will resume normal dosing after procedure.  CC:  Brunetta Jeans, PA-C

## 2020-01-20 NOTE — Progress Notes (Signed)
Attending Physician's Attestation   I have reviewed the chart.   I agree with the Advanced Practitioner's note, impression, and recommendations with any updates as below. Agree with need for endoscopic evaluation.  Would like to also do an upper endoscopy to rule out enteropathy as well.  We will have my staff work on adding on endoscopy to the day of his colonoscopy as able.   Justice Britain, MD [facility] Advanced Endoscopy Office # [phone]

## 2020-01-20 NOTE — Patient Instructions (Addendum)
If you are age 74 or older, your body mass index should be between 23-30. Your Body mass index is 34.7 kg/m. If this is out of the aforementioned range listed, please consider follow up with your Primary Care Provider.  If you are age 19 or younger, your body mass index should be between 19-25. Your Body mass index is 34.7 kg/m. If this is out of the aformentioned range listed, please consider follow up with your Primary Care Provider.   You have been scheduled for a colonoscopy. Please follow written instructions given to you at your visit today.  Please pick up your prep supplies at the pharmacy within the next 1-3 days. If you use inhalers (even only as needed), please bring them with you on the day of your procedure.  Due to recent changes in healthcare laws, you may see the results of your imaging and laboratory studies on MyChart before your provider has had a chance to review them.  We understand that in some cases there may be results that are confusing or concerning to you. Not all laboratory results come back in the same time frame and the provider may be waiting for multiple results in order to interpret others.  Please give [place] 48 hours in order for your provider to thoroughly review all the results before contacting the office for clarification of your results.

## 2020-01-23 NOTE — Progress Notes (Signed)
EGD added and Amy Hazelwood notified for insurance purposes.

## 2020-01-26 ENCOUNTER — Encounter: Payer: Self-pay | Admitting: Gastroenterology

## 2020-01-26 ENCOUNTER — Ambulatory Visit (AMBULATORY_SURGERY_CENTER): Payer: Medicare Other | Admitting: Gastroenterology

## 2020-01-26 ENCOUNTER — Other Ambulatory Visit: Payer: Self-pay

## 2020-01-26 ENCOUNTER — Other Ambulatory Visit (INDEPENDENT_AMBULATORY_CARE_PROVIDER_SITE_OTHER): Payer: Medicare Other

## 2020-01-26 VITALS — BP 146/77 | HR 77 | Temp 96.9°F | Resp 19 | Ht 66.0 in | Wt 215.0 lb

## 2020-01-26 DIAGNOSIS — K5730 Diverticulosis of large intestine without perforation or abscess without bleeding: Secondary | ICD-10-CM

## 2020-01-26 DIAGNOSIS — K51218 Ulcerative (chronic) proctitis with other complication: Secondary | ICD-10-CM

## 2020-01-26 DIAGNOSIS — D120 Benign neoplasm of cecum: Secondary | ICD-10-CM | POA: Diagnosis not present

## 2020-01-26 DIAGNOSIS — K51919 Ulcerative colitis, unspecified with unspecified complications: Secondary | ICD-10-CM

## 2020-01-26 DIAGNOSIS — K648 Other hemorrhoids: Secondary | ICD-10-CM

## 2020-01-26 DIAGNOSIS — I10 Essential (primary) hypertension: Secondary | ICD-10-CM

## 2020-01-26 DIAGNOSIS — D123 Benign neoplasm of transverse colon: Secondary | ICD-10-CM

## 2020-01-26 DIAGNOSIS — K5150 Left sided colitis without complications: Secondary | ICD-10-CM | POA: Diagnosis not present

## 2020-01-26 DIAGNOSIS — K5289 Other specified noninfective gastroenteritis and colitis: Secondary | ICD-10-CM

## 2020-01-26 DIAGNOSIS — K529 Noninfective gastroenteritis and colitis, unspecified: Secondary | ICD-10-CM

## 2020-01-26 DIAGNOSIS — E1159 Type 2 diabetes mellitus with other circulatory complications: Secondary | ICD-10-CM

## 2020-01-26 LAB — CBC
HCT: 44.2 % (ref 39.0–52.0)
Hemoglobin: 15.1 g/dL (ref 13.0–17.0)
MCHC: 34.1 g/dL (ref 30.0–36.0)
MCV: 100.6 fl — ABNORMAL HIGH (ref 78.0–100.0)
Platelets: 222 10*3/uL (ref 150.0–400.0)
RBC: 4.39 Mil/uL (ref 4.22–5.81)
RDW: 15.2 % (ref 11.5–15.5)
WBC: 9.7 10*3/uL (ref 4.0–10.5)

## 2020-01-26 LAB — HIGH SENSITIVITY CRP: CRP, High Sensitivity: 25.73 mg/L — ABNORMAL HIGH (ref 0.000–5.000)

## 2020-01-26 LAB — SEDIMENTATION RATE: Sed Rate: 36 mm/hr — ABNORMAL HIGH (ref 0–20)

## 2020-01-26 MED ORDER — SODIUM CHLORIDE 0.9 % IV SOLN: 500.0000 mL | Freq: Once | INTRAVENOUS | Status: DC

## 2020-01-26 NOTE — Op Note (Addendum)
[facility] Patient Name: Kevin Daniel Procedure Date: 01/26/2020 11:03 AM MRN: [id number] Endoscopist: Justice Britain , MD Age: 74 Referring MD:  Date of Birth: [birth year] Gender: Male Account #: [id number] Procedure:                Colonoscopy Indications:              Chronic diarrhea, Change in bowel habits Medicines:                Monitored Anesthesia Care Procedure:                Pre-Anesthesia Assessment:                           - Prior to the procedure, a History and Physical                            was performed, and patient medications and                            allergies were reviewed. The patient's tolerance of                            previous anesthesia was also reviewed. The risks                            and benefits of the procedure and the sedation                            options and risks were discussed with the patient.                            All questions were answered, and informed consent                            was obtained. Prior Anticoagulants: The patient has                            taken no previous anticoagulant or antiplatelet                            agents. ASA Grade Assessment: III - A patient with                            severe systemic disease. After reviewing the risks                            and benefits, the patient was deemed in                            satisfactory condition to undergo the procedure.                           After obtaining informed consent, the colonoscope  was passed under direct vision. Throughout the                            procedure, the patient's blood pressure, pulse, and                            oxygen saturations were monitored continuously. The                            Colonoscope was introduced through the anus and                            advanced to the the sigmoid colon. The Colonoscope                            was introduced  through the anus and advanced to the                            5 cm into the ileum. The colonoscopy was                            technically difficult and complex due to bowel                            stenosis and restricted mobility of the colon.                            Successful completion of the procedure was aided by                            changing the patient's position, using manual                            pressure, withdrawing and reinserting the scope,                            withdrawing the scope and replacing with the                            pediatric colonoscope, straightening and shortening                            the scope to obtain bowel loop reduction and using                            scope torsion. The patient tolerated the procedure.                            The quality of the bowel preparation was adequate.                            The terminal ileum, ileocecal valve, appendiceal  orifice, and rectum were photographed. Scope In: 11:11:40 AM Scope Out: 11:49:24 AM Scope Withdrawal Time: 0 hours 22 minutes 47 seconds  Total Procedure Duration: 0 hours 37 minutes 44 seconds  Findings:                 The perianal exam findings include a perianal                            erythema with concern for some skin breakdown and                            potential development in some time for decuitus                            ulceration.                           The digital rectal exam findings include                            hemorrhoids. Pertinent negatives include no                            palpable rectal lesions.                           An intrinsic severe stenosis measuring 1.1 cm                            (inner diameter) was found in the sigmoid colon and                            was traversed only after downsizing to the                            pediatric colonoscope and movement of the patient                             to his back and then back to left lateral as well                            as significant abdominal and suprapubic pressure.                           The visualized terminal ileum appeared normal.                            Biopsies were taken with a cold forceps for                            histology to rule out chronic ileitis.                           Many small-mouthed diverticula were found in the  entire colon (though majority of diverticular                            burden is in the left colon).                           Two sessile polyps were found in the transverse                            colon and ileocecal valve. The polyps were 4 to 10                            mm in size. These polyps were removed with a cold                            snare. Resection and retrieval were complete.                           Normal mucosa was found in the transverse colon, in                            the ascending colon and in the cecum. Biopsies were                            taken with a cold forceps for histology to rule out                            chronic colitis.                           Segmental moderate inflammation characterized by                            altered vascularity, congestion (edema), erythema,                            friability and granularity was found in the                            recto-sigmoid colon, in the sigmoid colon and in                            the distal descending colon - this was also in a                            region of significant diverticular burden. Biopsies                            were taken with a cold forceps for histology to                            rule out chronic colitis. The previously described  narrowing/stenosis was noted in this region as well.                           Normal mucosa was found in the rectum. Biopsies                             were taken with a cold forceps for histology.                           Non-bleeding non-thrombosed external and internal                            hemorrhoids were found during retroflexion, during                            perianal exam and during digital exam. The                            hemorrhoids were Grade II (internal hemorrhoids                            that prolapse but reduce spontaneously). Complications:            No immediate complications. Estimated Blood Loss:     Estimated blood loss was minimal. Impression:               - Perianal erythema with some skin breakdown found.                            Hemorrhoids found on digital rectal exam.                           - Stenosis of the colon noted in [place]. Traversed                            after downsizing scope and with signficant                            manuevers as noted above.                           - The examined portion of the ileum was normal.                            Biopsied.                           - Diverticulosis in the entire examined colon.                           - Two 4 to 10 mm polyps in the transverse colon and                            at the ileocecal valve, removed with a cold snare.  Resected and retrieved.                           - Normal mucosa in the transverse colon, in the                            ascending colon and in the cecum. Biopsied.                           - Segmental moderate inflammation was found in the                            recto-sigmoid colon, in the sigmoid colon and in                            the distal descending colon secondary to colitis.                            This is in region of the previously noted stenosis.                            Also a significant diverticular burden noted in                            this region. Biopsied.                           - Normal mucosa in the rectum. Biopsied.                            - Non-bleeding non-thrombosed external and internal                            hemorrhoids.                           - Wonder if the patient's previously described                            diverticulitis has led to adhesive disease with                            such significant restriction of the bowel. Recommendation:           - The patient will be observed post-procedure,                            until all discharge criteria are met.                           - Discharge patient to home.                           - Patient has a contact number available for  emergencies. The signs and symptoms of potential                            delayed complications were discussed with the                            patient. Return to normal activities tomorrow.                            Written discharge instructions were provided to the                            patient.                           - Low fiber diet.                           - Continue present medications.                           - Await pathology results.                           - Repeat colonoscopy for surveillance based on                            pathology results.                           - Obtain CBC/ESR/CRP today.                           - If findings of IBD are noted on pathology then                            additional laboratories will be required but will                            be ordered at that time.                           - Recommend Desitin or Barrier cream to the                            perineum daily in effort of trying to preserve skin                            integrity.                           - Potential role of Mesalamine products will be                            considered.                           -  Follow up to be dictated by results of the                            pathology.                           - The findings and  recommendations were discussed                            with the patient.                           - The findings and recommendations were discussed                            with the patient's family. Justice Britain, MD 01/26/2020 12:08:59 PM

## 2020-01-26 NOTE — Progress Notes (Signed)
Report given to PACU, vss 

## 2020-01-26 NOTE — Progress Notes (Signed)
Called to room to assist during endoscopic procedure.  Patient ID and intended procedure confirmed with present staff. Received instructions for my participation in the procedure from the performing physician.  

## 2020-01-26 NOTE — Patient Instructions (Signed)
YOU HAD AN ENDOSCOPIC PROCEDURE TODAY AT THE [facility]:   Refer to the procedure report that was given to you for any specific questions about what was found during the examination.  If the procedure report does not answer your questions, please call your gastroenterologist to clarify.  If you requested that your care partner not be given the details of your procedure findings, then the procedure report has been included in a sealed envelope for you to review at your convenience later.  YOU SHOULD EXPECT: Some feelings of bloating in the abdomen. Passage of more gas than usual.  Walking can help get rid of the air that was put into your GI tract during the procedure and reduce the bloating. If you had a lower endoscopy (such as a colonoscopy or flexible sigmoidoscopy) you may notice spotting of blood in your stool or on the toilet paper. If you underwent a bowel prep for your procedure, you may not have a normal bowel movement for a few days.  Please Note:  You might notice some irritation and congestion in your nose or some drainage.  This is from the oxygen used during your procedure.  There is no need for concern and it should clear up in a day or so.  SYMPTOMS TO REPORT IMMEDIATELY:   Following lower endoscopy (colonoscopy or flexible sigmoidoscopy):  Excessive amounts of blood in the stool  Significant tenderness or worsening of abdominal pains  Swelling of the abdomen that is new, acute  Fever of 100F or higher  For urgent or emergent issues, a gastroenterologist can be reached at any hour by calling [phone]. Do not use MyChart messaging for urgent concerns.    DIET:  We do recommend a small meal at first, but then you may proceed to your regular diet.  Drink plenty of fluids but you should avoid alcoholic beverages for 24 hours.  ACTIVITY:  You should plan to take it easy for the rest of today and you should NOT DRIVE or use heavy machinery until tomorrow (because  of the sedation medicines used during the test).    FOLLOW UP: Our staff will call the number listed on your records 48-72 hours following your procedure to check on you and address any questions or concerns that you may have regarding the information given to you following your procedure. If we do not reach you, we will leave a message.  We will attempt to reach you two times.  During this call, we will ask if you have developed any symptoms of COVID 19. If you develop any symptoms (ie: fever, flu-like symptoms, shortness of breath, cough etc.) before then, please call [phone].  If you test positive for Covid 19 in the 2 weeks post procedure, please call and report this information to us.    If any biopsies were taken you will be contacted by phone or by letter within the next 1-3 weeks.  Please call us at [phone] if you have not heard about the biopsies in 3 weeks.    SIGNATURES/CONFIDENTIALITY: You and/or your care partner have signed paperwork which will be entered into your electronic medical record.  These signatures attest to the fact that that the information above on your After Visit Summary has been reviewed and is understood.  Full responsibility of the confidentiality of this discharge information lies with you and/or your care-partner. 

## 2020-01-26 NOTE — Progress Notes (Signed)
Patient had been scheduled for EGD/Colonoscopy for further evaluation of issues of chronic diarrhea. He is adament that he does not want the upper endoscopy. I explained rationale to try to rule out enteropathies and other disorder, but he does not want this. He understands if workup is unremarkable that he may still require an EGD at some point and accepts that it would need to be another procedure date. We will move forward with colonoscopy.   Kevin Britain, MD [facility] Advanced Endoscopy Office # [phone]

## 2020-01-26 NOTE — Progress Notes (Signed)
VS- Christell Constant

## 2020-01-30 ENCOUNTER — Telehealth: Payer: Self-pay | Admitting: *Deleted

## 2020-01-30 NOTE — Telephone Encounter (Signed)
1. Have you developed a fever since your procedure? no  2.   Have you had an respiratory symptoms (SOB or cough) since your procedure? no  3.   Have you tested positive for COVID 19 since your procedure no  4.   Have you had any family members/close contacts diagnosed with the COVID 19 since your procedure?  no   If yes to any of these questions please route to Joylene John, RN and Joella Prince, RN Follow up Call-  Call back number 01/26/2020  Post procedure Call Back phone  # [phone]  Permission to leave phone message Yes     Patient questions:  Do you have a fever, pain , or abdominal swelling? No. Pain Score  0 *  Have you tolerated food without any problems? Yes.    Have you been able to return to your normal activities? Yes.    Do you have any questions about your discharge instructions: Diet   No. Medications  No. Follow up visit  No.  Do you have questions or concerns about your Care? No.  Actions: * If pain score is 4 or above: No action needed, pain <4.

## 2020-02-05 ENCOUNTER — Telehealth: Payer: Self-pay | Admitting: Gastroenterology

## 2020-02-05 NOTE — Telephone Encounter (Signed)
I called and spoke with the patient on 02/03/2020. We discussed the results of his colonoscopy. There is some changes consistent with chronic colitis as well as some proctitis although not chronic in nature. The findings were not absolutely consistent with ulcerative colitis but concerning enough. Patient's bowel habits slightly better than the last couple days but still having episodes of diarrhea. I think it is reasonable for [place] to move forward with the patient being initiated on a trial for the next few months of oral topical mesalamine. As well I am going to put him on a steroid taper for period of time. We will need to obtain some laboratories as well in the effort of trying to get the patient ready for potential biologic therapy should he fail oral therapy.  Patty, please move forward with ordering the following labs: HAV IgM & IgG HBsAb, HBsAg, HBcAb IgG HCV Ab VZV IgG Quantiferon Gold TPMT Level Vitamin D Level  Patty, Please move forward with ordering the following medications: Prednisone taper -40 mg x 2 weeks -30 mg x 2 weeks -20 mg x 2 weeks -10 mg x 2 weeks -5 mg x 2 weeks -Off Lialda 4.8 g daily  Most ideally, the patient would come in for his laboratories before the initiation of the prednisone if possible so that we can have a good setting of laboratories. Please make sure that the patient has medications but also tries to come in as soon as able this coming week.  Please let me know if there are any other issues.  GM

## 2020-02-06 ENCOUNTER — Other Ambulatory Visit: Payer: Self-pay | Admitting: Physician Assistant

## 2020-02-06 ENCOUNTER — Other Ambulatory Visit (INDEPENDENT_AMBULATORY_CARE_PROVIDER_SITE_OTHER): Payer: Medicare Other

## 2020-02-06 ENCOUNTER — Other Ambulatory Visit: Payer: Self-pay

## 2020-02-06 ENCOUNTER — Other Ambulatory Visit: Payer: Self-pay | Admitting: Family Medicine

## 2020-02-06 DIAGNOSIS — K51919 Ulcerative colitis, unspecified with unspecified complications: Secondary | ICD-10-CM

## 2020-02-06 LAB — VITAMIN D 25 HYDROXY (VIT D DEFICIENCY, FRACTURES): VITD: 49.11 ng/mL (ref 30.00–100.00)

## 2020-02-06 MED ORDER — MESALAMINE 1.2 G PO TBEC: 4.8000 g | DELAYED_RELEASE_TABLET | Freq: Every day | ORAL | 0 refills | Status: DC

## 2020-02-06 MED ORDER — PREDNISONE 10 MG PO TABS: ORAL_TABLET | ORAL | 0 refills | Status: DC

## 2020-02-06 NOTE — Telephone Encounter (Signed)
The pt has been advised and prescription has been sent as prescribed.  Lab also entered and pt states he will come in today for labs and then begin prednisone taper.  He will finish that and then start lialda.

## 2020-02-09 ENCOUNTER — Encounter: Payer: Self-pay | Admitting: Gastroenterology

## 2020-02-12 LAB — QUANTIFERON-TB GOLD PLUS
Mitogen-NIL: 0.53 IU/mL
NIL: 0.02 IU/mL
QuantiFERON-TB Gold Plus: NEGATIVE
TB1-NIL: 0 IU/mL
TB2-NIL: 0 IU/mL

## 2020-02-12 LAB — HEPATITIS B CORE ANTIBODY, TOTAL: Hep B Core Total Ab: NONREACTIVE

## 2020-02-12 LAB — VARICELLA ZOSTER ANTIBODY, IGG: Varicella IgG: 3649 index

## 2020-02-12 LAB — THIOPURINE METHYLTRANSFERASE (TPMT), RBC: Thiopurine Methyltransferase, RBC: 14 nmol/hr/mL RBC

## 2020-02-12 LAB — HEPATITIS C ANTIBODY
Hepatitis C Ab: NONREACTIVE
SIGNAL TO CUT-OFF: 0.02 (ref <1.00)

## 2020-02-12 LAB — HEPATITIS B SURFACE ANTIGEN: Hepatitis B Surface Ag: NONREACTIVE

## 2020-02-12 LAB — HEPATITIS B SURFACE ANTIBODY,QUALITATIVE: Hep B S Ab: NONREACTIVE

## 2020-02-12 LAB — HEPATITIS A ANTIBODY, TOTAL: Hepatitis A AB,Total: REACTIVE — AB

## 2020-02-27 ENCOUNTER — Ambulatory Visit (INDEPENDENT_AMBULATORY_CARE_PROVIDER_SITE_OTHER): Payer: Medicare Other

## 2020-02-27 ENCOUNTER — Other Ambulatory Visit: Payer: Self-pay

## 2020-02-27 DIAGNOSIS — E1169 Type 2 diabetes mellitus with other specified complication: Secondary | ICD-10-CM

## 2020-02-27 DIAGNOSIS — E785 Hyperlipidemia, unspecified: Secondary | ICD-10-CM | POA: Diagnosis not present

## 2020-02-27 LAB — HEPATIC FUNCTION PANEL
ALT: 42 U/L (ref 0–53)
AST: 33 U/L (ref 0–37)
Albumin: 3.6 g/dL (ref 3.5–5.2)
Alkaline Phosphatase: 96 U/L (ref 39–117)
Bilirubin, Direct: 0.4 mg/dL — ABNORMAL HIGH (ref 0.0–0.3)
Total Bilirubin: 1.2 mg/dL (ref 0.2–1.2)
Total Protein: 6.6 g/dL (ref 6.0–8.3)

## 2020-02-27 LAB — LIPID PANEL
Cholesterol: 209 mg/dL — ABNORMAL HIGH (ref 0–200)
HDL: 128.3 mg/dL (ref >39.00)
LDL Cholesterol: 57 mg/dL (ref 0–99)
NonHDL: 80.6
Total CHOL/HDL Ratio: 2
Triglycerides: 116 mg/dL (ref 0.0–149.0)
VLDL: 23.2 mg/dL (ref 0.0–40.0)

## 2020-02-28 ENCOUNTER — Other Ambulatory Visit: Payer: Self-pay | Admitting: Gastroenterology

## 2020-03-02 ENCOUNTER — Telehealth: Payer: Self-pay | Admitting: Physician Assistant

## 2020-03-02 NOTE — Telephone Encounter (Signed)
Patient is requesting in home physical therapy - please call

## 2020-03-02 NOTE — Telephone Encounter (Signed)
He will need an appointment -- Medicare requires face to Face to start new [facility] services.

## 2020-03-05 ENCOUNTER — Encounter: Payer: Self-pay | Admitting: Physician Assistant

## 2020-03-05 NOTE — Telephone Encounter (Signed)
Patient advised by my chart messages that he needs an appointment to discuss. This can be video if leaving the house is a fall risk

## 2020-03-05 NOTE — Telephone Encounter (Signed)
He would need appt for F2F for Medicare to cover home health physical therapy.

## 2020-03-06 ENCOUNTER — Other Ambulatory Visit: Payer: Self-pay

## 2020-03-06 ENCOUNTER — Telehealth (INDEPENDENT_AMBULATORY_CARE_PROVIDER_SITE_OTHER): Payer: Medicare Other | Admitting: Physician Assistant

## 2020-03-06 ENCOUNTER — Encounter: Payer: Self-pay | Admitting: Physician Assistant

## 2020-03-06 DIAGNOSIS — W19XXXA Unspecified fall, initial encounter: Secondary | ICD-10-CM

## 2020-03-06 DIAGNOSIS — Y92009 Unspecified place in unspecified non-institutional (private) residence as the place of occurrence of the external cause: Secondary | ICD-10-CM

## 2020-03-06 DIAGNOSIS — R2681 Unsteadiness on feet: Secondary | ICD-10-CM | POA: Diagnosis not present

## 2020-03-06 NOTE — Progress Notes (Signed)
Virtual Visit via Video   I connected with patient on 03/06/20 at 11:00 AM EDT by a video enabled telemedicine application and verified that I am speaking with the correct person using two identifiers.  Location patient: Home Location provider: [facility], Office Persons participating in the virtual visit: Patient, Provider, [place] (Patina Moore)  I discussed the limitations of evaluation and management by telemedicine and the availability of in person appointments. The patient expressed understanding and agreed to proceed.  Subjective:   HPI:   Patient presents via [place] today requesting a [place] referral for physical therapy. Patient endorses poor confidence with outside ambulation. Feels unsteady on his feet and worried about falls, especially giving history of falls. Notes this past week on 03/02/2020 he was outside in the yard and lost balance, having fall. Denies any head trauma, trauma or body or LOC. Was able to get back up with help of the rescue squad who checked him out at the time. Felt fine after. Denies any chest pain, SOB, lightheadedness or dizziness preceding fall. No other fall in quite some time. Denies weakness in the legs or feeling unbalanced inside. Denies joint pain or torso and lower extremities. Not using any assistive devices inside. Since recent fall he has purchased a 4-pronged cane and has been using inside and outside.   ROS:   See pertinent positives and negatives per HPI.  Patient Active Problem List   Diagnosis Date Noted  . Pressure injury of skin of sacral region 10/03/2019  . Chronic diarrhea 10/03/2019  . Chronic fatigue 10/03/2019  . Type 2 diabetes mellitus with stage 2 chronic kidney disease, with long-term current use of insulin ([place]) 10/03/2019  . Hypertension associated with diabetes ([place]) 10/03/2019  . Hyperlipidemia associated with type 2 diabetes mellitus ([place]) 10/03/2019    Social History   Tobacco Use  . Smoking status:  Never Smoker  . Smokeless tobacco: Never Used  Substance Use Topics  . Alcohol use: Yes    Comment: occasionally    Current Outpatient Medications:  .  allopurinol (ZYLOPRIM) 300 MG tablet, TAKE 1 TABLET BY MOUTH EVERY DAY, Disp: 90 tablet, Rfl: 0 .  chlorthalidone (HYGROTON) 25 MG tablet, Take 1 tablet (25 mg total) by mouth daily., Disp: 90 tablet, Rfl: 1 .  Continuous Blood Gluc Receiver (FREESTYLE LIBRE 14 DAY READER) DEVI, Apply reader for monitoring of continuous glucose monitoring, Disp: 1 each, Rfl: 11 .  Continuous Blood Gluc Sensor (FREESTYLE LIBRE 14 DAY SENSOR) MISC, Apply sensor every 14 days for continuous blood glucose monitoring, Disp: 1 each, Rfl: 11 .  escitalopram (LEXAPRO) 20 MG tablet, Take 1 tablet (20 mg total) by mouth daily., Disp: 90 tablet, Rfl: 0 .  insulin NPH Human (NOVOLIN N) 100 UNIT/ML injection, Inject into the skin. 35U daily, Disp: , Rfl:  .  insulin regular (NOVOLIN R) 100 units/mL injection, Inject into the skin 3 (three) times daily before meals., Disp: , Rfl:  .  loperamide (IMODIUM A-D) 2 MG tablet, Take 2 mg by mouth as needed for diarrhea or loose stools., Disp: , Rfl:  .  losartan (COZAAR) 50 MG tablet, TAKE 1 TABLET BY MOUTH EVERY DAY, Disp: 90 tablet, Rfl: 0 .  mesalamine (LIALDA) 1.2 g EC tablet, TAKE 4 TABLETS BY MOUTH DAILY WITH BREAKFAST., Disp: 120 tablet, Rfl: 0 .  metoprolol tartrate (LOPRESSOR) 50 MG tablet, TAKE 1&1/2 TABLETS BY MOUTH TWICE A DAY, Disp: 270 tablet, Rfl: 1 .  predniSONE (DELTASONE) 10 MG tablet, Take 4  tablets (40 mg total) by mouth daily with breakfast for 14 days, THEN 3 tablets (30 mg total) daily with breakfast for 14 days, THEN 2 tablets (20 mg total) daily with breakfast for 14 days, THEN 1 tablet (10 mg total) daily with breakfast for 14 days, THEN 0.5 tablets (5 mg total) daily with breakfast for 14 days., Disp: 147 tablet, Rfl: 0 .  traMADol (ULTRAM) 50 MG tablet, Take 50 mg by mouth every 6 (six) hours as needed.,  Disp: , Rfl:  .  traZODone (DESYREL) 100 MG tablet, Take 1 tablet (100 mg total) by mouth at bedtime., Disp: 90 tablet, Rfl: 1  Allergies  Allergen Reactions  . Lisinopril Swelling and Other (See Comments)    angioedema    Objective:   There were no vitals taken for this visit.  Patient is well-developed, well-nourished in no acute distress.  Resting comfortably at home.  Head is normocephalic, atraumatic.  No labored breathing.  Speech is clear and coherent with logical content.  Patient is alert and oriented at baseline.   Assessment and Plan:   1. Fall in home, initial encounter 2. Unsteady gait No trauma or injury. Doing well. Very hesitant to leave the house due to imbalance. Will send out [facility] RN and PT for evaluation and strengthening.  - Ambulatory referral to [place], [place] 03/06/2020

## 2020-03-06 NOTE — Progress Notes (Signed)
I have discussed the procedure for the virtual visit with the patient who has given consent to proceed with assessment and treatment.   Kevin Daniel S Kevin Daniel, CMA     

## 2020-03-29 ENCOUNTER — Other Ambulatory Visit: Payer: Self-pay | Admitting: Gastroenterology

## 2020-04-02 ENCOUNTER — Other Ambulatory Visit: Payer: Self-pay | Admitting: Emergency Medicine

## 2020-04-02 ENCOUNTER — Encounter: Payer: Self-pay | Admitting: Physician Assistant

## 2020-04-02 MED ORDER — ESCITALOPRAM OXALATE 20 MG PO TABS
20.0000 mg | ORAL_TABLET | Freq: Every day | ORAL | 1 refills | Status: DC
Start: 2020-04-02 — End: 2020-11-19

## 2020-04-10 ENCOUNTER — Telehealth: Payer: Self-pay | Admitting: Physician Assistant

## 2020-04-10 NOTE — Telephone Encounter (Signed)
Therapist states that she needs to change his nursing frequency to 1 this week.  They will add Friday's visit to the end of episode.  Patient is living in a camper and it is broke down and they are getting another one moved in this week.

## 2020-04-11 NOTE — Telephone Encounter (Signed)
FYI

## 2020-04-12 ENCOUNTER — Telehealth: Payer: Self-pay

## 2020-04-12 ENCOUNTER — Telehealth: Payer: Self-pay | Admitting: Physician Assistant

## 2020-04-12 NOTE — Telephone Encounter (Signed)
[place] for verbal orders.   F2F was 03/06/2020 so if he needs another one to continue services we will need to get him scheduled.

## 2020-04-12 NOTE — Telephone Encounter (Signed)
[place] for verbal orders for patient?

## 2020-04-12 NOTE — Telephone Encounter (Signed)
Pt's wife called in stating that pt has fallen 2X in the past 24hrs. She states that she has called EMS to help him get up out of the floor and he refused to go to the ER to be checked out. She states that she needs help trying to get him into a rehab facility. She dosen't know what to do and needs some help.  Please call [phone]

## 2020-04-12 NOTE — Telephone Encounter (Signed)
..  Home Health Verbal Orders  Agency:  [facility]   Caller:  Monique - PT   Contact and title  Requesting OT/ PT/ Skilled nursing/ Social Work/ Speech:   PT   Reason for Request:  Strengthening, safe transfer, gate balance training, home exercise program, home safety   Frequency:  PT 1 time a week for one week, 2 times a week for 5 weeks and 1 time a week for 1 week     HH needs F2F w/in last 30 days

## 2020-04-13 ENCOUNTER — Telehealth: Payer: Self-pay | Admitting: Physician Assistant

## 2020-04-13 ENCOUNTER — Other Ambulatory Visit: Payer: Self-pay

## 2020-04-13 DIAGNOSIS — R627 Adult failure to thrive: Secondary | ICD-10-CM

## 2020-04-13 DIAGNOSIS — R2681 Unsteadiness on feet: Secondary | ICD-10-CM

## 2020-04-13 NOTE — Telephone Encounter (Signed)
Please advise 

## 2020-04-13 NOTE — Telephone Encounter (Signed)
Left a detailed message with PCP recommendations per patient's DPR. Will wait to see if patient's spouse calls back. Referral placed.

## 2020-04-13 NOTE — Telephone Encounter (Signed)
Called Alexia with [facility] and left detailed vm message with diagnosis codes per PCP.

## 2020-04-13 NOTE — Telephone Encounter (Signed)
Spoke with [place] with [facility]. Verbal orders given per PCP orders.

## 2020-04-13 NOTE — Telephone Encounter (Signed)
Needs ER evaluation.  Encourage him to try to go to be evaluated. As far as getting him with a skilled nursing facility, he will need evaluation first, in office with me if nothing else. Okay to place referral to medical social work for failure to thrive/gait instability so that they can help patient and his wife with nursing facility placement if needed.

## 2020-04-13 NOTE — Telephone Encounter (Signed)
They called back and said that they need a diagnosis other than gate instability and failure to thrive - Needs to know what are causing these - such as diabetes, etc.

## 2020-04-13 NOTE — Telephone Encounter (Signed)
Kevin Daniel states that they need a PDGM diagnosis - needs an actual clinical diagnosis - Please advise

## 2020-04-13 NOTE — Telephone Encounter (Signed)
Called [place] with [facility] and verbal orders were given per PCP.

## 2020-04-13 NOTE — Telephone Encounter (Signed)
Diagnoses are gait instability, high fall risk.

## 2020-04-16 ENCOUNTER — Emergency Department (HOSPITAL_COMMUNITY): Payer: Medicare Other

## 2020-04-16 ENCOUNTER — Inpatient Hospital Stay (HOSPITAL_COMMUNITY)
Admission: EM | Admit: 2020-04-16 | Discharge: 2020-04-19 | DRG: 291 | Disposition: A | Discharge status: Skilled Nursing Facility | Payer: Medicare Other | Attending: Internal Medicine | Admitting: Internal Medicine

## 2020-04-16 ENCOUNTER — Encounter (HOSPITAL_COMMUNITY): Payer: Self-pay | Admitting: Emergency Medicine

## 2020-04-16 ENCOUNTER — Other Ambulatory Visit: Payer: Self-pay

## 2020-04-16 DIAGNOSIS — I130 Hypertensive heart and chronic kidney disease with heart failure and stage 1 through stage 4 chronic kidney disease, or unspecified chronic kidney disease: Principal | ICD-10-CM | POA: Diagnosis present

## 2020-04-16 DIAGNOSIS — W19XXXA Unspecified fall, initial encounter: Secondary | ICD-10-CM | POA: Diagnosis not present

## 2020-04-16 DIAGNOSIS — R600 Localized edema: Secondary | ICD-10-CM

## 2020-04-16 DIAGNOSIS — I452 Bifascicular block: Secondary | ICD-10-CM | POA: Diagnosis present

## 2020-04-16 DIAGNOSIS — I4891 Unspecified atrial fibrillation: Secondary | ICD-10-CM | POA: Diagnosis not present

## 2020-04-16 DIAGNOSIS — N182 Chronic kidney disease, stage 2 (mild): Secondary | ICD-10-CM

## 2020-04-16 DIAGNOSIS — E1122 Type 2 diabetes mellitus with diabetic chronic kidney disease: Secondary | ICD-10-CM | POA: Diagnosis present

## 2020-04-16 DIAGNOSIS — Z8261 Family history of arthritis: Secondary | ICD-10-CM

## 2020-04-16 DIAGNOSIS — Z951 Presence of aortocoronary bypass graft: Secondary | ICD-10-CM

## 2020-04-16 DIAGNOSIS — Z8673 Personal history of transient ischemic attack (TIA), and cerebral infarction without residual deficits: Secondary | ICD-10-CM

## 2020-04-16 DIAGNOSIS — Z79899 Other long term (current) drug therapy: Secondary | ICD-10-CM

## 2020-04-16 DIAGNOSIS — Z888 Allergy status to other drugs, medicaments and biological substances status: Secondary | ICD-10-CM

## 2020-04-16 DIAGNOSIS — R531 Weakness: Secondary | ICD-10-CM | POA: Diagnosis not present

## 2020-04-16 DIAGNOSIS — G4733 Obstructive sleep apnea (adult) (pediatric): Secondary | ICD-10-CM | POA: Diagnosis present

## 2020-04-16 DIAGNOSIS — Z20822 Contact with and (suspected) exposure to covid-19: Secondary | ICD-10-CM | POA: Diagnosis present

## 2020-04-16 DIAGNOSIS — Z794 Long term (current) use of insulin: Secondary | ICD-10-CM

## 2020-04-16 DIAGNOSIS — R2689 Other abnormalities of gait and mobility: Secondary | ICD-10-CM | POA: Diagnosis present

## 2020-04-16 DIAGNOSIS — E785 Hyperlipidemia, unspecified: Secondary | ICD-10-CM | POA: Diagnosis present

## 2020-04-16 DIAGNOSIS — R5382 Chronic fatigue, unspecified: Secondary | ICD-10-CM | POA: Diagnosis not present

## 2020-04-16 DIAGNOSIS — Z800 Family history of malignant neoplasm of digestive organs: Secondary | ICD-10-CM

## 2020-04-16 DIAGNOSIS — M1990 Unspecified osteoarthritis, unspecified site: Secondary | ICD-10-CM | POA: Diagnosis present

## 2020-04-16 DIAGNOSIS — M810 Age-related osteoporosis without current pathological fracture: Secondary | ICD-10-CM | POA: Diagnosis present

## 2020-04-16 DIAGNOSIS — Z825 Family history of asthma and other chronic lower respiratory diseases: Secondary | ICD-10-CM

## 2020-04-16 DIAGNOSIS — L8990 Pressure ulcer of unspecified site, unspecified stage: Secondary | ICD-10-CM | POA: Insufficient documentation

## 2020-04-16 DIAGNOSIS — R7989 Other specified abnormal findings of blood chemistry: Secondary | ICD-10-CM

## 2020-04-16 DIAGNOSIS — I5031 Acute diastolic (congestive) heart failure: Secondary | ICD-10-CM

## 2020-04-16 DIAGNOSIS — Z823 Family history of stroke: Secondary | ICD-10-CM

## 2020-04-16 DIAGNOSIS — R2681 Unsteadiness on feet: Secondary | ICD-10-CM

## 2020-04-16 DIAGNOSIS — Z8042 Family history of malignant neoplasm of prostate: Secondary | ICD-10-CM

## 2020-04-16 DIAGNOSIS — Z23 Encounter for immunization: Secondary | ICD-10-CM

## 2020-04-16 DIAGNOSIS — L89152 Pressure ulcer of sacral region, stage 2: Secondary | ICD-10-CM | POA: Diagnosis present

## 2020-04-16 DIAGNOSIS — R296 Repeated falls: Secondary | ICD-10-CM | POA: Diagnosis present

## 2020-04-16 DIAGNOSIS — R0989 Other specified symptoms and signs involving the circulatory and respiratory systems: Secondary | ICD-10-CM

## 2020-04-16 LAB — GLUCOSE, CAPILLARY: Glucose-Capillary: 225 mg/dL — ABNORMAL HIGH (ref 70–99)

## 2020-04-16 LAB — CBC WITH DIFFERENTIAL/PLATELET
Abs Immature Granulocytes: 0.17 10*3/uL — ABNORMAL HIGH (ref 0.00–0.07)
Basophils Absolute: 0.1 10*3/uL (ref 0.0–0.1)
Basophils Relative: 1 %
Eosinophils Absolute: 0.1 10*3/uL (ref 0.0–0.5)
Eosinophils Relative: 1 %
HCT: 39.6 % (ref 39.0–52.0)
Hemoglobin: 13 g/dL (ref 13.0–17.0)
Immature Granulocytes: 2 %
Lymphocytes Relative: 12 %
Lymphs Abs: 1.1 10*3/uL (ref 0.7–4.0)
MCH: 34.5 pg — ABNORMAL HIGH (ref 26.0–34.0)
MCHC: 32.8 g/dL (ref 30.0–36.0)
MCV: 105 fL — ABNORMAL HIGH (ref 80.0–100.0)
Monocytes Absolute: 0.7 10*3/uL (ref 0.1–1.0)
Monocytes Relative: 7 %
Neutro Abs: 7.2 10*3/uL (ref 1.7–7.7)
Neutrophils Relative %: 77 %
Platelets: 204 10*3/uL (ref 150–400)
RBC: 3.77 MIL/uL — ABNORMAL LOW (ref 4.22–5.81)
RDW: 14.5 % (ref 11.5–15.5)
WBC: 9.2 10*3/uL (ref 4.0–10.5)
nRBC: 0 % (ref 0.0–0.2)

## 2020-04-16 LAB — COMPREHENSIVE METABOLIC PANEL
ALT: 17 U/L (ref 0–44)
AST: 19 U/L (ref 15–41)
Albumin: 2.8 g/dL — ABNORMAL LOW (ref 3.5–5.0)
Alkaline Phosphatase: 88 U/L (ref 38–126)
Anion gap: 10 (ref 5–15)
BUN: 18 mg/dL (ref 8–23)
CO2: 27 mmol/L (ref 22–32)
Calcium: 8.9 mg/dL (ref 8.9–10.3)
Chloride: 96 mmol/L — ABNORMAL LOW (ref 98–111)
Creatinine, Ser: 0.83 mg/dL (ref 0.61–1.24)
GFR, Estimated: >60 mL/min (ref >60)
Glucose, Bld: 185 mg/dL — ABNORMAL HIGH (ref 70–99)
Potassium: 4 mmol/L (ref 3.5–5.1)
Sodium: 133 mmol/L — ABNORMAL LOW (ref 135–145)
Total Bilirubin: 0.7 mg/dL (ref 0.3–1.2)
Total Protein: 7 g/dL (ref 6.5–8.1)

## 2020-04-16 LAB — MAGNESIUM: Magnesium: 1.4 mg/dL — ABNORMAL LOW (ref 1.7–2.4)

## 2020-04-16 LAB — URINALYSIS, ROUTINE W REFLEX MICROSCOPIC
Glucose, UA: NEGATIVE mg/dL
Hgb urine dipstick: NEGATIVE
Ketones, ur: NEGATIVE mg/dL
Leukocytes,Ua: NEGATIVE
Nitrite: NEGATIVE
Protein, ur: NEGATIVE mg/dL
Specific Gravity, Urine: >1.03 — ABNORMAL HIGH (ref 1.005–1.030)
pH: 5 (ref 5.0–8.0)

## 2020-04-16 LAB — RAPID URINE DRUG SCREEN, HOSP PERFORMED
Amphetamines: NOT DETECTED
Barbiturates: NOT DETECTED
Benzodiazepines: NOT DETECTED
Cocaine: NOT DETECTED
Opiates: NOT DETECTED
Tetrahydrocannabinol: NOT DETECTED

## 2020-04-16 LAB — RESP PANEL BY RT-PCR (FLU A&B, COVID) ARPGX2
Influenza A by PCR: NEGATIVE
Influenza B by PCR: NEGATIVE
SARS Coronavirus 2 by RT PCR: NEGATIVE

## 2020-04-16 LAB — BRAIN NATRIURETIC PEPTIDE: B Natriuretic Peptide: 149 pg/mL — ABNORMAL HIGH (ref 0.0–100.0)

## 2020-04-16 LAB — AMMONIA: Ammonia: 12 umol/L (ref 9–35)

## 2020-04-16 LAB — TROPONIN I (HIGH SENSITIVITY): Troponin I (High Sensitivity): 5 ng/L (ref <18)

## 2020-04-16 LAB — ETHANOL: Alcohol, Ethyl (B): <10 mg/dL (ref <10)

## 2020-04-16 LAB — LIPASE, BLOOD: Lipase: 22 U/L (ref 11–51)

## 2020-04-16 LAB — CBG MONITORING, ED: Glucose-Capillary: 172 mg/dL — ABNORMAL HIGH (ref 70–99)

## 2020-04-16 LAB — TSH: TSH: 2.347 u[IU]/mL (ref 0.350–4.500)

## 2020-04-16 MED ORDER — TRAZODONE HCL 50 MG PO TABS
100.0000 mg | ORAL_TABLET | Freq: Every day | ORAL | Status: DC
  Administered 2020-04-16 – 2020-04-18 (×3): 100 mg via ORAL
  Filled 2020-04-16 (×3): qty 2

## 2020-04-16 MED ORDER — METOPROLOL TARTRATE 50 MG PO TABS
50.0000 mg | ORAL_TABLET | Freq: Two times a day (BID) | ORAL | Status: DC
  Administered 2020-04-16 – 2020-04-19 (×6): 50 mg via ORAL
  Filled 2020-04-16 (×7): qty 1

## 2020-04-16 MED ORDER — INSULIN ASPART 100 UNIT/ML ~~LOC~~ SOLN
0.0000 [IU] | Freq: Three times a day (TID) | SUBCUTANEOUS | Status: DC
  Administered 2020-04-17 – 2020-04-19 (×8): 3 [IU] via SUBCUTANEOUS

## 2020-04-16 MED ORDER — FUROSEMIDE 10 MG/ML IJ SOLN
40.0000 mg | Freq: Once | INTRAMUSCULAR | Status: AC
  Administered 2020-04-16: 40 mg via INTRAVENOUS
  Filled 2020-04-16: qty 4

## 2020-04-16 MED ORDER — ALLOPURINOL 300 MG PO TABS
300.0000 mg | ORAL_TABLET | Freq: Every day | ORAL | Status: DC
  Administered 2020-04-17 – 2020-04-19 (×3): 300 mg via ORAL
  Filled 2020-04-16 (×3): qty 1

## 2020-04-16 MED ORDER — ESCITALOPRAM OXALATE 10 MG PO TABS
20.0000 mg | ORAL_TABLET | Freq: Every day | ORAL | Status: DC
  Administered 2020-04-17 – 2020-04-19 (×3): 20 mg via ORAL
  Filled 2020-04-16 (×3): qty 2

## 2020-04-16 MED ORDER — INSULIN ASPART 100 UNIT/ML ~~LOC~~ SOLN
0.0000 [IU] | Freq: Every day | SUBCUTANEOUS | Status: DC
  Administered 2020-04-16: 2 [IU] via SUBCUTANEOUS

## 2020-04-16 MED ORDER — ENOXAPARIN SODIUM 40 MG/0.4ML ~~LOC~~ SOLN
40.0000 mg | SUBCUTANEOUS | Status: DC
  Administered 2020-04-16 – 2020-04-18 (×3): 40 mg via SUBCUTANEOUS
  Filled 2020-04-16 (×3): qty 0.4

## 2020-04-16 MED ORDER — MAGNESIUM SULFATE 2 GM/50ML IV SOLN
2.0000 g | Freq: Once | INTRAVENOUS | Status: AC
  Administered 2020-04-16: 2 g via INTRAVENOUS
  Filled 2020-04-16: qty 50

## 2020-04-16 MED ORDER — SODIUM CHLORIDE 0.9 % IV BOLUS
250.0000 mL | Freq: Once | INTRAVENOUS | Status: AC
  Administered 2020-04-16: 250 mL via INTRAVENOUS

## 2020-04-16 NOTE — ED Triage Notes (Signed)
Per EMS, pt called out for weakness x 2 months. EMS responded for a fall on 12/9 but pt refused transport. Pt has b/l lower extremity edema noted.

## 2020-04-16 NOTE — H&P (Addendum)
History and Physical    Kevin Daniel DDU:[id number] DOB: 07/10/1945 DOA: 04/16/2020  PCP: Brunetta Jeans, PA-C   Patient coming from: Home  I have personally briefly reviewed patient's old medical records in [place]  Chief Complaint: Weakness, fall  HPI: Kevin Daniel is a 74 y.o. male with medical history significant for atrial fibrillation, hypertension, OSA, diabetes mellitus. Patient presented to the ED with complaints of generalized weakness of 2 weeks, with bilateral lower extremity swelling.  Patient reports that he cannot ambulate at home.  In the past month, his second fall was 12/9, EMS was called, the patient did not want to come to the ED. Patient reports bilateral lower extremity swelling over the past few weeks.  He denies abdominal bloating, he denies difficulty breathing, no chest pain.  He has a chronic unchanged cough.   He tells me his normal weight is about 225 pounds, and he has actually lost weight, now weighing 205 pounds. Patient lives with his spouse in a motor home.  He previously used to walk without assistance, but with onset of weakness, he has barely been able to ambulate at all over the past month.  Patient would like some rehab therapy.  ED Course: Temperature 97.5, heart rate 50s to 60s, blood pressure systolic 237 to 628B, O2 sats greater than 94% on room air, .  Blood alcohol level less than 10.  Ammonia level 12.  BNP mildly elevated 149.  Hemoglobin stable 13.  EKG shows atrial fibrillation.  Portable chest x-ray shows atelectasis versus pneumonia, vascular congestion. 250 mils normal saline given in ED, hospitalist admit for fall, weakness and possible new onset CHF.  Review of Systems: As per HPI all other systems reviewed and negative.  Past Medical History:  Diagnosis Date  . Anxiety   . Arthritis   . Atrial fibrillation ([place])   . Cataract   . Depression   . Diabetes mellitus without complication ([place])   .  Hyperlipidemia   . Hypertension   . Osteoporosis   . Sleep apnea    no CPAP, sleeps in recliner  . Stroke [facility])    2010- ? TIA    Past Surgical History:  Procedure Laterality Date  . ANKLE FRACTURE SURGERY Left 2020  . APPENDECTOMY  1964  . COLONOSCOPY    . CORONARY ARTERY BYPASS GRAFT  2010   at time of COX-MAZE procedure  . COX-MAZE MICROWAVE ABLATION  2010   for AFib- done at time of Bypass- made scar tissue  . TOE AMPUTATION Left      reports that he has never smoked. He has never used smokeless tobacco. He reports current alcohol use. He reports previous drug use.  Allergies  Allergen Reactions  . Lisinopril Swelling and Other (See Comments)    angioedema    Family History  Problem Relation Age of Onset  . Arthritis Mother   . COPD Mother   . Early death Mother   . Stroke Father   . Colon cancer Father   . Prostate cancer Father   . Rectal cancer Father   . Arthritis Brother   . Stroke Brother   . Esophageal cancer Neg Hx   . Pancreatic cancer Neg Hx   . Stomach cancer Neg Hx   . Liver disease Neg Hx     Prior to Admission medications   Medication Sig Start Date End Date Taking? Authorizing Provider  allopurinol (ZYLOPRIM) 300 MG tablet TAKE 1 TABLET BY MOUTH EVERY  DAY Patient taking differently: Take 300 mg by mouth daily. 02/06/20  Yes Brunetta Jeans, PA-C  chlorthalidone (HYGROTON) 25 MG tablet Take 1 tablet (25 mg total) by mouth daily. 01/12/20  Yes Brunetta Jeans, PA-C  escitalopram (LEXAPRO) 20 MG tablet Take 1 tablet (20 mg total) by mouth daily. 04/02/20  Yes Brunetta Jeans, PA-C  insulin NPH Human (NOVOLIN N) 100 UNIT/ML injection Inject into the skin daily as needed (for sugar).   Yes [provider]  insulin regular (NOVOLIN R) 100 units/mL injection Inject into the skin 3 (three) times daily as needed for high blood sugar.   Yes [provider]  losartan (COZAAR) 50 MG tablet TAKE 1 TABLET BY MOUTH EVERY DAY Patient  taking differently: Take 50 mg by mouth daily. 02/06/20  Yes Brunetta Jeans, PA-C  metoprolol tartrate (LOPRESSOR) 50 MG tablet TAKE 1&1/2 TABLETS BY MOUTH TWICE A DAY Patient taking differently: Take 50 mg by mouth 2 (two) times daily. 12/19/19  Yes Brunetta Jeans, PA-C  traMADol (ULTRAM) 50 MG tablet Take 50 mg by mouth every 6 (six) hours as needed for moderate pain.   Yes [provider]  traZODone (DESYREL) 100 MG tablet Take 1 tablet (100 mg total) by mouth at bedtime. 12/21/19  Yes Brunetta Jeans, PA-C  Continuous Blood Gluc Receiver (FREESTYLE LIBRE 14 DAY READER) DEVI Apply reader for monitoring of continuous glucose monitoring 09/30/19   Brunetta Jeans, PA-C  Continuous Blood Gluc Sensor (FREESTYLE LIBRE 14 DAY SENSOR) MISC Apply sensor every 14 days for continuous blood glucose monitoring 09/30/19   Brunetta Jeans, PA-C  mesalamine (LIALDA) 1.2 g EC tablet TAKE 4 TABLETS BY MOUTH DAILY WITH BREAKFAST. Patient not taking: No sig reported 04/02/20   Mansouraty, Telford Nab., MD  predniSONE (DELTASONE) 10 MG tablet Take 4 tablets (40 mg total) by mouth daily with breakfast for 14 days, THEN 3 tablets (30 mg total) daily with breakfast for 14 days, THEN 2 tablets (20 mg total) daily with breakfast for 14 days, THEN 1 tablet (10 mg total) daily with breakfast for 14 days, THEN 0.5 tablets (5 mg total) daily with breakfast for 14 days. Patient not taking: Reported on 04/16/2020 02/06/20 04/16/20  Mansouraty, Telford Nab., MD    Physical Exam: Vitals:   04/16/20 1330 04/16/20 1400 04/16/20 1430 04/16/20 1530  BP: 102/65 (!) 120/56 (!) 106/56 (!) 109/53  Pulse: 68 (!) 38 61 64  Resp: 20 (!) 23 12 18   Temp:      TempSrc:      SpO2: 96% (!) 88% 95% 95%  Weight:      Height:        Constitutional: NAD, calm, comfortable Vitals:   04/16/20 1330 04/16/20 1400 04/16/20 1430 04/16/20 1530  BP: 102/65 (!) 120/56 (!) 106/56 (!) 109/53  Pulse: 68 (!) 38 61 64  Resp: 20 (!) 23  12 18   Temp:      TempSrc:      SpO2: 96% (!) 88% 95% 95%  Weight:      Height:       Eyes: PERRL, lids and conjunctivae normal ENMT: Mucous membranes are moist.  Neck: normal, supple, no masses, no thyromegaly Respiratory: clear to auscultation bilaterally, no wheezing, no crackles. Normal respiratory effort. No accessory muscle use.  Cardiovascular: Regular rate and rhythm, no murmurs / rubs / gallops.  Bilateral feet puffy  more so than his legs which have trace to +1 bilateral pitting extremity edema.  2+ pedal pulses.  Abdomen: no tenderness, no masses palpated. No hepatosplenomegaly. Bowel sounds positive.  Musculoskeletal: no clubbing / cyanosis. No joint deformity upper and lower extremities. Good ROM, no contractures. Normal muscle tone.  Skin: Multiple purpuric/ ecchymotic areas to bilateral upper extremities from fall, and patient rubbing against objects, no, ulcers. No induration Neurologic: CN 2-12 grossly intact. Sensation intact, 4/5 strength bilateral upper extremity, 3/5 strength bilateral lower extremities.Marland Kitchen  Psychiatric: Normal judgment and insight. Alert and oriented x 3. Normal mood.   Labs on Admission: I have personally reviewed following labs and imaging studies  CBC: Recent Labs  Lab 04/16/20 1244  WBC 9.2  NEUTROABS 7.2  HGB 13.0  HCT 39.6  MCV 105.0*  PLT 102   Basic Metabolic Panel: Recent Labs  Lab 04/16/20 1244  NA 133*  K 4.0  CL 96*  CO2 27  GLUCOSE 185*  BUN 18  CREATININE 0.83  CALCIUM 8.9  MG 1.4*   Liver Function Tests: Recent Labs  Lab 04/16/20 1244  AST 19  ALT 17  ALKPHOS 88  BILITOT 0.7  PROT 7.0  ALBUMIN 2.8*   Recent Labs  Lab 04/16/20 1244  LIPASE 22   Recent Labs  Lab 04/16/20 1244  AMMONIA 12   CBG: Recent Labs  Lab 04/16/20 1144  GLUCAP 172*   Urine analysis:    Component Value Date/Time   COLORURINE YELLOW 04/16/2020 [employer] 04/16/2020 1223   LABSPEC >1.030 (H) 04/16/2020 1223    PHURINE 5.0 04/16/2020 1223   GLUCOSEU NEGATIVE 04/16/2020 1223   HGBUR NEGATIVE 04/16/2020 1223   BILIRUBINUR SMALL (A) 04/16/2020 1223   KETONESUR NEGATIVE 04/16/2020 1223   PROTEINUR NEGATIVE 04/16/2020 1223   NITRITE NEGATIVE 04/16/2020 1223   LEUKOCYTESUR NEGATIVE 04/16/2020 1223    Radiological Exams on Admission: CT Head Wo Contrast  Result Date: 04/16/2020 CLINICAL DATA:  Minor head trauma EXAM: CT HEAD WITHOUT CONTRAST TECHNIQUE: Contiguous axial images were obtained from the base of the skull through the vertex without intravenous contrast. COMPARISON:  None. FINDINGS: Brain: Generalized atrophy most prominent the frontal lobes. Negative for hydrocephalus. Mild white matter changes. Chronic infarct right cerebellum. Negative for acute infarct, hemorrhage, mass. Vascular: Negative for hyperdense vessel Skull: Negative Sinuses/Orbits: Mild mucosal edema right maxillary sinus otherwise clear sinuses. Negative orbit. Other: None IMPRESSION: No acute abnormality. Generalized atrophy with mild chronic microvascular ischemic change in the white matter. Chronic infarct right inferior cerebellum. Electronically Signed   By: Franchot Gallo M.D.   On: 04/16/2020 14:29   DG Chest Portable 1 View  Result Date: 04/16/2020 CLINICAL DATA:  History of generalized weakness in a 74 year old male EXAM: PORTABLE CHEST 1 VIEW COMPARISON:  None FINDINGS: Median sternotomy. Heart size is enlarged and accentuated by portable technique. Minimal opacities at the LEFT lung base. Vascular congestion without frank edema. No gross effusion. On limited assessment no acute skeletal process. IMPRESSION: 1. Minimal opacities at the LEFT lung base may represent atelectasis versus developing infection. 2. Vascular congestion without frank edema. 3. Post median sternotomy with cardiac enlargement. Electronically Signed   By: Zetta Bills M.D.   On: 04/16/2020 12:59    EKG: Independently reviewed.  Atrial fibrillation  rate 67.  QTc 445.  RBBB, LPFB.  No old EKG to compare.  Assessment/Plan Principal Problem:   Fall Active Problems:   Chronic fatigue   Type 2 diabetes mellitus with stage 2 chronic kidney disease, with long-term current use of insulin (HCC)  Generalized weakness,  fall- he has a history of chronic fatigue.  Currently unable to ambulate.  Reports mildly hitting his head after fall.  Head CT without acute abnormality, shows chronic white matter microvascular ischemic changes. -PT evaluation - Patient would benefit from temporary rehab.    Bilateral lower extremity swelling-possibly due to immobility, third spacing from hypoalbuminemia- 2.8, venous insufficiency or mild new onset CHF.  He denies dyspnea, but chest x-ray shows vascular congestion without overt pulmonary edema.  ~94% on room air.  BNP-149, no prior to compare.  Troponin V. -IV Lasix 40 mg x 1 -Obtain echocardiogram  Hypomagnesemia 1.4.  Likely from chlorthalidone.   - Replete.  Atrial fibrillation, currently in atrial fibrillation.  Rate controlled on metoprolol, but not on anticoagulation. Chad2Vasc score ofd at least 3- Age, sex, HTN, DM.  Listed in problem list, but no prior documentation about why patient is not on anticoagulation.  But with his recent falls, with ecchymosis on his upper extremities, am worried about his fall risk.  Diabetes mellitus-random glucose 185. - SSI- M - Hgba1c -Hold home sliding scale NPH for now  Hypertension-soft. -Hold chlorthalidone, losartan for now -Resume metoprolol 50 twice daily  DVT prophylaxis: Lovenox Code Status: Full code Family Communication: None at bedside Disposition Plan: ~ 2 days Consults called: None Admission status: Observation, telemetry   Bethena Roys MD [facility]  04/16/2020, 5:39 PM

## 2020-04-16 NOTE — Telephone Encounter (Signed)
Diabetes with neuropathy.

## 2020-04-16 NOTE — ED Provider Notes (Signed)
[facility] EMERGENCY DEPARTMENT Provider Note   CSN: [id number] Arrival date & time: 04/16/20  1133     History Chief Complaint  Patient presents with  . Weakness    Kevin Daniel is a 74 y.o. male history of atrial fibrillation, diabetes, hypertension, hyperlipidemia, CVA, sleep apnea.  Patient reports he was encouraged to call EMS today by his primary care provider due to generalized weakness and difficulty with ambulating around his house which has been going on for around 2 months.  He reports he has been feeling generally tired and weak but denies any focal weakness.  He reports around 1 week ago he lost his balance and fell onto his left side he denies any injury from that fall and he refused EMS transportation at that time.  He has no specific complaint aside from generalized weakness today which has been constant for 2 months.  Denies fever/chills, head injury, blood thinner use, headache, vision changes, neck pain, back pain, chest pain, abdominal pain, nausea/vomiting, diarrhea, unilateral weakness, numbness, tingling or any additional concerns.  HPI     Past Medical History:  Diagnosis Date  . Anxiety   . Arthritis   . Atrial fibrillation ([place])   . Cataract   . Depression   . Diabetes mellitus without complication ([place])   . Hyperlipidemia   . Hypertension   . Osteoporosis   . Sleep apnea    no CPAP, sleeps in recliner  . Stroke [facility])    2010- ? TIA    Patient Active Problem List   Diagnosis Date Noted  . Fall 04/16/2020  . Pressure injury of skin of sacral region 10/03/2019  . Chronic diarrhea 10/03/2019  . Chronic fatigue 10/03/2019  . Type 2 diabetes mellitus with stage 2 chronic kidney disease, with long-term current use of insulin ([place]) 10/03/2019  . Hypertension associated with diabetes ([place]) 10/03/2019  . Hyperlipidemia associated with type 2 diabetes mellitus ([place]) 10/03/2019    Past Surgical History:  Procedure Laterality Date  . ANKLE  FRACTURE SURGERY Left 2020  . APPENDECTOMY  1964  . COLONOSCOPY    . CORONARY ARTERY BYPASS GRAFT  2010   at time of COX-MAZE procedure  . COX-MAZE MICROWAVE ABLATION  2010   for AFib- done at time of Bypass- made scar tissue  . TOE AMPUTATION Left        Family History  Problem Relation Age of Onset  . Arthritis Mother   . COPD Mother   . Early death Mother   . Stroke Father   . Colon cancer Father   . Prostate cancer Father   . Rectal cancer Father   . Arthritis Brother   . Stroke Brother   . Esophageal cancer Neg Hx   . Pancreatic cancer Neg Hx   . Stomach cancer Neg Hx   . Liver disease Neg Hx     Social History   Tobacco Use  . Smoking status: Never Smoker  . Smokeless tobacco: Never Used  Vaping Use  . Vaping Use: Never used  Substance Use Topics  . Alcohol use: Yes    Comment: occasionally  . Drug use: Not Currently    Home Medications Prior to Admission medications   Medication Sig Start Date End Date Taking? Authorizing Provider  allopurinol (ZYLOPRIM) 300 MG tablet TAKE 1 TABLET BY MOUTH EVERY DAY Patient taking differently: Take 300 mg by mouth daily. 02/06/20  Yes Brunetta Jeans, PA-C  chlorthalidone (HYGROTON) 25 MG tablet Take 1 tablet (  25 mg total) by mouth daily. 01/12/20  Yes Brunetta Jeans, PA-C  escitalopram (LEXAPRO) 20 MG tablet Take 1 tablet (20 mg total) by mouth daily. 04/02/20  Yes Brunetta Jeans, PA-C  insulin NPH Human (NOVOLIN N) 100 UNIT/ML injection Inject into the skin daily as needed (for sugar).   Yes [provider]  insulin regular (NOVOLIN R) 100 units/mL injection Inject into the skin 3 (three) times daily as needed for high blood sugar.   Yes [provider]  losartan (COZAAR) 50 MG tablet TAKE 1 TABLET BY MOUTH EVERY DAY Patient taking differently: Take 50 mg by mouth daily. 02/06/20  Yes Brunetta Jeans, PA-C  metoprolol tartrate (LOPRESSOR) 50 MG tablet TAKE 1&1/2 TABLETS BY MOUTH TWICE A  DAY Patient taking differently: Take 50 mg by mouth 2 (two) times daily. 12/19/19  Yes Brunetta Jeans, PA-C  traMADol (ULTRAM) 50 MG tablet Take 50 mg by mouth every 6 (six) hours as needed for moderate pain.   Yes [provider]  traZODone (DESYREL) 100 MG tablet Take 1 tablet (100 mg total) by mouth at bedtime. 12/21/19  Yes Brunetta Jeans, PA-C  Continuous Blood Gluc Receiver (FREESTYLE LIBRE 14 DAY READER) DEVI Apply reader for monitoring of continuous glucose monitoring 09/30/19   Brunetta Jeans, PA-C  Continuous Blood Gluc Sensor (FREESTYLE LIBRE 14 DAY SENSOR) MISC Apply sensor every 14 days for continuous blood glucose monitoring 09/30/19   Brunetta Jeans, PA-C  mesalamine (LIALDA) 1.2 g EC tablet TAKE 4 TABLETS BY MOUTH DAILY WITH BREAKFAST. Patient not taking: No sig reported 04/02/20   Mansouraty, Telford Nab., MD  predniSONE (DELTASONE) 10 MG tablet Take 4 tablets (40 mg total) by mouth daily with breakfast for 14 days, THEN 3 tablets (30 mg total) daily with breakfast for 14 days, THEN 2 tablets (20 mg total) daily with breakfast for 14 days, THEN 1 tablet (10 mg total) daily with breakfast for 14 days, THEN 0.5 tablets (5 mg total) daily with breakfast for 14 days. Patient not taking: Reported on 04/16/2020 02/06/20 04/16/20  Mansouraty, Telford Nab., MD    Allergies    Lisinopril  Review of Systems   Review of Systems Ten systems are reviewed and are negative for acute change except as noted in the HPI  Physical Exam Updated Vital Signs BP (!) 109/53   Pulse 64   Temp (!) 97.5 F (36.4 C) (Oral)   Resp 18   Ht 5\' 6"  (1.676 m)   Wt 93 kg   SpO2 95%   BMI 33.09 kg/m   Physical Exam Constitutional:      General: He is not in acute distress.    Appearance: Normal appearance. He is well-developed. He is not ill-appearing or diaphoretic.  HENT:     Head: Normocephalic and atraumatic.  Eyes:     General: Vision grossly intact. Gaze aligned appropriately.      Pupils: Pupils are equal, round, and reactive to light.  Neck:     Trachea: Trachea and phonation normal.  Pulmonary:     Effort: Pulmonary effort is normal. No respiratory distress.  Abdominal:     General: There is no distension.     Palpations: Abdomen is soft.     Tenderness: There is no abdominal tenderness. There is no guarding or rebound.  Musculoskeletal:        General: Normal range of motion.     Cervical back: Normal range of motion.  Comments: No midline C/T/L spinal tenderness to palpation, no paraspinal muscle tenderness, no deformity, crepitus, or step-off noted. No sign of injury to the neck or back.  No pain with compression of the pelvis bilaterally.  Patient able to lift bilateral legs off the bed without pain or difficulty.  All major joints of bilateral upper and lower extremities mobilized without pain or crepitus.  Skin:    General: Skin is warm and dry.     Comments: Bruising of various ages to arms and legs without bleeding or pain.  Neurological:     Mental Status: He is alert.     GCS: GCS eye subscore is 4. GCS verbal subscore is 5. GCS motor subscore is 6.     Comments: Speech is clear and goal oriented, follows commands Major Cranial nerves without deficit, no facial droop Moves extremities without ataxia, coordination intact  Psychiatric:        Behavior: Behavior normal.     ED Results / Procedures / Treatments   Labs (all labs ordered are listed, but only abnormal results are displayed) Labs Reviewed  CBC WITH DIFFERENTIAL/PLATELET - Abnormal; Notable for the following components:      Result Value   RBC 3.77 (*)    MCV 105.0 (*)    MCH 34.5 (*)    Abs Immature Granulocytes 0.17 (*)    All other components within normal limits  COMPREHENSIVE METABOLIC PANEL - Abnormal; Notable for the following components:   Sodium 133 (*)    Chloride 96 (*)    Glucose, Bld 185 (*)    Albumin 2.8 (*)    All other components within normal limits   URINALYSIS, ROUTINE W REFLEX MICROSCOPIC - Abnormal; Notable for the following components:   Specific Gravity, Urine >1.030 (*)    Bilirubin Urine SMALL (*)    All other components within normal limits  MAGNESIUM - Abnormal; Notable for the following components:   Magnesium 1.4 (*)    All other components within normal limits  BRAIN NATRIURETIC PEPTIDE - Abnormal; Notable for the following components:   B Natriuretic Peptide 149.0 (*)    All other components within normal limits  CBG MONITORING, ED - Abnormal; Notable for the following components:   Glucose-Capillary 172 (*)    All other components within normal limits  RESP PANEL BY RT-PCR (FLU A&B, COVID) ARPGX2  LIPASE, BLOOD  AMMONIA  ETHANOL  RAPID URINE DRUG SCREEN, HOSP PERFORMED  TSH  TROPONIN I (HIGH SENSITIVITY)    EKG EKG Interpretation  Date/Time:  Monday April 16 2020 11:46:57 EST Ventricular Rate:  67 PR Interval:    QRS Duration: 145 QT Interval:  431 QTC Calculation: 455 R Axis:   103 Text Interpretation: Atrial fibrillation RBBB and LPFB Confirmed by Milton Ferguson [phone]) on 04/16/2020 3:41:10 PM   Radiology CT Head Wo Contrast  Result Date: 04/16/2020 CLINICAL DATA:  Minor head trauma EXAM: CT HEAD WITHOUT CONTRAST TECHNIQUE: Contiguous axial images were obtained from the base of the skull through the vertex without intravenous contrast. COMPARISON:  None. FINDINGS: Brain: Generalized atrophy most prominent the frontal lobes. Negative for hydrocephalus. Mild white matter changes. Chronic infarct right cerebellum. Negative for acute infarct, hemorrhage, mass. Vascular: Negative for hyperdense vessel Skull: Negative Sinuses/Orbits: Mild mucosal edema right maxillary sinus otherwise clear sinuses. Negative orbit. Other: None IMPRESSION: No acute abnormality. Generalized atrophy with mild chronic microvascular ischemic change in the white matter. Chronic infarct right inferior cerebellum. Electronically  Signed   By: Juanda Crumble  Carlis Abbott M.D.   On: 04/16/2020 14:29   DG Chest Portable 1 View  Result Date: 04/16/2020 CLINICAL DATA:  History of generalized weakness in a 74 year old male EXAM: PORTABLE CHEST 1 VIEW COMPARISON:  None FINDINGS: Median sternotomy. Heart size is enlarged and accentuated by portable technique. Minimal opacities at the LEFT lung base. Vascular congestion without frank edema. No gross effusion. On limited assessment no acute skeletal process. IMPRESSION: 1. Minimal opacities at the LEFT lung base may represent atelectasis versus developing infection. 2. Vascular congestion without frank edema. 3. Post median sternotomy with cardiac enlargement. Electronically Signed   By: Zetta Bills M.D.   On: 04/16/2020 12:59    Procedures Procedures (including critical care time)  Medications Ordered in ED Medications  magnesium sulfate IVPB 2 g 50 mL (has no administration in time range)  sodium chloride 0.9 % bolus 250 mL (0 mLs Intravenous Stopped 04/16/20 1336)    ED Course  I have reviewed the triage vital signs and the nursing notes.  Pertinent labs & imaging results that were available during my care of the patient were reviewed by me and considered in my medical decision making (see chart for details).    MDM Rules/Calculators/A&P                         Additional history obtained from: 1. Nursing notes from this visit. 2. Review of electronic medical records. 3. Family, patient's wife at bedside. ---------------- I ordered, reviewed and interpreted labs which include: CBG 172 UDS negative Covid/influenza panel negative BNP elevated at 149, no priors to compare, patient without history of CHF High-sensitivity troponin within normal limits Magnesium slightly decreased at 1.4 Lipase, ammonia, ethanol all within normal limits CMP shows no emergent electrolyte derangement, AKI, LFT elevations or gap. CBC without leukocytosis or anemia. Urinalysis without evidence  of infection  CXR:  IMPRESSION:  1. Minimal opacities at the LEFT lung base may represent atelectasis  versus developing infection.  2. Vascular congestion without frank edema.  3. Post median sternotomy with cardiac enlargement.  Patient denies fever productive cough low suspicion for bacterial pneumonia at this time.  CT Head:  IMPRESSION:  No acute abnormality. Generalized atrophy with mild chronic  microvascular ischemic change in the white matter. Chronic infarct  right inferior cerebellum.   EKG: Atrial fibrillation RBBB and LPFB Confirmed by Milton Ferguson [phone]) on 04/16/2020 3:41:10 PM  - Patient with what appears to be new CHF contributing to generalized weakness.  He is unsafe to return home, his wife is at bedside who is unable to care for the patient as well.  Plan of care is admission to hospitalist service for new CHF, gait instability, frequent falls and failure to perform ADLs.  Patient seen and evaluated by Dr. Alvino Chapel during this visit who agrees with plan of care to admit to hospitalist service at this time. - 3:35 PM: Consulted hospitalist Dr. Denton Brick, patient has been accepted to their service.  Note: Portions of this report may have been transcribed using voice recognition software. Every effort was made to ensure accuracy; however, inadvertent computerized transcription errors may still be present. Final Clinical Impression(s) / ED Diagnoses Final diagnoses:  Generalized weakness  Fall, initial encounter  Gait instability  Elevated brain natriuretic peptide (BNP) level  Pulmonary vascular congestion    Rx / DC Orders ED Discharge Orders    None       Deliah Boston, PA-C 04/16/20 1546  Davonna Belling, MD 04/17/20 [phone]

## 2020-04-16 NOTE — Telephone Encounter (Signed)
Called Kevin Daniel with [facility] to give dx of Diabetes with Neuropathy. She voiced understanding.

## 2020-04-17 ENCOUNTER — Observation Stay (HOSPITAL_COMMUNITY): Payer: Medicare Other

## 2020-04-17 ENCOUNTER — Encounter: Payer: Self-pay | Admitting: Physician Assistant

## 2020-04-17 DIAGNOSIS — I361 Nonrheumatic tricuspid (valve) insufficiency: Secondary | ICD-10-CM | POA: Diagnosis not present

## 2020-04-17 DIAGNOSIS — M810 Age-related osteoporosis without current pathological fracture: Secondary | ICD-10-CM | POA: Diagnosis present

## 2020-04-17 DIAGNOSIS — Z888 Allergy status to other drugs, medicaments and biological substances status: Secondary | ICD-10-CM | POA: Diagnosis not present

## 2020-04-17 DIAGNOSIS — Z794 Long term (current) use of insulin: Secondary | ICD-10-CM | POA: Diagnosis not present

## 2020-04-17 DIAGNOSIS — R2681 Unsteadiness on feet: Secondary | ICD-10-CM | POA: Diagnosis not present

## 2020-04-17 DIAGNOSIS — M1990 Unspecified osteoarthritis, unspecified site: Secondary | ICD-10-CM | POA: Diagnosis present

## 2020-04-17 DIAGNOSIS — N182 Chronic kidney disease, stage 2 (mild): Secondary | ICD-10-CM

## 2020-04-17 DIAGNOSIS — Z8261 Family history of arthritis: Secondary | ICD-10-CM | POA: Diagnosis not present

## 2020-04-17 DIAGNOSIS — E1122 Type 2 diabetes mellitus with diabetic chronic kidney disease: Secondary | ICD-10-CM | POA: Diagnosis present

## 2020-04-17 DIAGNOSIS — R2243 Localized swelling, mass and lump, lower limb, bilateral: Secondary | ICD-10-CM | POA: Diagnosis not present

## 2020-04-17 DIAGNOSIS — R296 Repeated falls: Secondary | ICD-10-CM | POA: Diagnosis present

## 2020-04-17 DIAGNOSIS — E785 Hyperlipidemia, unspecified: Secondary | ICD-10-CM | POA: Diagnosis present

## 2020-04-17 DIAGNOSIS — Z79899 Other long term (current) drug therapy: Secondary | ICD-10-CM | POA: Diagnosis not present

## 2020-04-17 DIAGNOSIS — R7989 Other specified abnormal findings of blood chemistry: Secondary | ICD-10-CM | POA: Diagnosis not present

## 2020-04-17 DIAGNOSIS — Z800 Family history of malignant neoplasm of digestive organs: Secondary | ICD-10-CM | POA: Diagnosis not present

## 2020-04-17 DIAGNOSIS — Z823 Family history of stroke: Secondary | ICD-10-CM | POA: Diagnosis not present

## 2020-04-17 DIAGNOSIS — I5031 Acute diastolic (congestive) heart failure: Secondary | ICD-10-CM | POA: Diagnosis present

## 2020-04-17 DIAGNOSIS — Z825 Family history of asthma and other chronic lower respiratory diseases: Secondary | ICD-10-CM | POA: Diagnosis not present

## 2020-04-17 DIAGNOSIS — W19XXXA Unspecified fall, initial encounter: Secondary | ICD-10-CM | POA: Diagnosis not present

## 2020-04-17 DIAGNOSIS — G4733 Obstructive sleep apnea (adult) (pediatric): Secondary | ICD-10-CM | POA: Diagnosis present

## 2020-04-17 DIAGNOSIS — L89152 Pressure ulcer of sacral region, stage 2: Secondary | ICD-10-CM | POA: Diagnosis present

## 2020-04-17 DIAGNOSIS — Z8673 Personal history of transient ischemic attack (TIA), and cerebral infarction without residual deficits: Secondary | ICD-10-CM | POA: Diagnosis not present

## 2020-04-17 DIAGNOSIS — E1140 Type 2 diabetes mellitus with diabetic neuropathy, unspecified: Secondary | ICD-10-CM | POA: Diagnosis not present

## 2020-04-17 DIAGNOSIS — R531 Weakness: Secondary | ICD-10-CM | POA: Diagnosis present

## 2020-04-17 DIAGNOSIS — I130 Hypertensive heart and chronic kidney disease with heart failure and stage 1 through stage 4 chronic kidney disease, or unspecified chronic kidney disease: Secondary | ICD-10-CM | POA: Diagnosis present

## 2020-04-17 DIAGNOSIS — I4891 Unspecified atrial fibrillation: Secondary | ICD-10-CM | POA: Diagnosis present

## 2020-04-17 DIAGNOSIS — Z23 Encounter for immunization: Secondary | ICD-10-CM | POA: Diagnosis present

## 2020-04-17 DIAGNOSIS — Z20822 Contact with and (suspected) exposure to covid-19: Secondary | ICD-10-CM | POA: Diagnosis present

## 2020-04-17 DIAGNOSIS — Z951 Presence of aortocoronary bypass graft: Secondary | ICD-10-CM | POA: Diagnosis not present

## 2020-04-17 DIAGNOSIS — Z8042 Family history of malignant neoplasm of prostate: Secondary | ICD-10-CM | POA: Diagnosis not present

## 2020-04-17 DIAGNOSIS — I129 Hypertensive chronic kidney disease with stage 1 through stage 4 chronic kidney disease, or unspecified chronic kidney disease: Secondary | ICD-10-CM | POA: Diagnosis not present

## 2020-04-17 DIAGNOSIS — I452 Bifascicular block: Secondary | ICD-10-CM | POA: Diagnosis present

## 2020-04-17 LAB — BASIC METABOLIC PANEL
Anion gap: 10 (ref 5–15)
BUN: 17 mg/dL (ref 8–23)
CO2: 28 mmol/L (ref 22–32)
Calcium: 8.6 mg/dL — ABNORMAL LOW (ref 8.9–10.3)
Chloride: 97 mmol/L — ABNORMAL LOW (ref 98–111)
Creatinine, Ser: 0.89 mg/dL (ref 0.61–1.24)
GFR, Estimated: >60 mL/min (ref >60)
Glucose, Bld: 173 mg/dL — ABNORMAL HIGH (ref 70–99)
Potassium: 3.4 mmol/L — ABNORMAL LOW (ref 3.5–5.1)
Sodium: 135 mmol/L (ref 135–145)

## 2020-04-17 LAB — GLUCOSE, CAPILLARY
Glucose-Capillary: 161 mg/dL — ABNORMAL HIGH (ref 70–99)
Glucose-Capillary: 170 mg/dL — ABNORMAL HIGH (ref 70–99)
Glucose-Capillary: 127 mg/dL — ABNORMAL HIGH (ref 70–99)
Glucose-Capillary: 159 mg/dL — ABNORMAL HIGH (ref 70–99)

## 2020-04-17 LAB — ECHOCARDIOGRAM COMPLETE
AR max vel: 2.75 cm2
AV Area VTI: 2.24 cm2
AV Area mean vel: 2.22 cm2
AV Mean grad: 2.5 mmHg
AV Peak grad: 4.9 mmHg
Ao pk vel: 1.11 m/s
Area-P 1/2: 4.41 cm2
Height: 69 in
S' Lateral: 2.9 cm
Weight: 3379.21 oz

## 2020-04-17 NOTE — NC FL2 (Signed)
[place] LEVEL OF CARE SCREENING TOOL     IDENTIFICATION  Patient Name: Kevin Daniel Birthdate: 1945-05-26 Sex: male Admission Date (Current Location): 04/16/2020  [facility] and [place] Number:  [employer] and Address:  [facility] [address] [place]      Provider Number: [id number]  Attending Physician Name and Address:  Kathie Dike, MD  Relative Name and Phone Number:       Current Level of Care: Hospital Recommended Level of Care: [place] Prior Approval Number:    Date Approved/Denied:   PASRR Number:    Discharge Plan: SNF    Current Diagnoses: Patient Active Problem List   Diagnosis Date Noted  . Fall 04/16/2020  . Unspecified atrial fibrillation ([employer]) 04/16/2020  . Pressure injury of skin 04/16/2020  . Pressure injury of skin of sacral region 10/03/2019  . Chronic diarrhea 10/03/2019  . Chronic fatigue 10/03/2019  . Type 2 diabetes mellitus with stage 2 chronic kidney disease, with long-term current use of insulin ([place]) 10/03/2019  . Hypertension associated with diabetes ([place]) 10/03/2019  . Hyperlipidemia associated with type 2 diabetes mellitus ([place]) 10/03/2019    Orientation RESPIRATION BLADDER Height & Weight     Self,Time,Situation,Place  Normal Continent Weight: 211 lb 3.2 oz (95.8 kg) Height:  5\' 9"  (175.3 cm)  BEHAVIORAL SYMPTOMS/MOOD NEUROLOGICAL BOWEL NUTRITION STATUS      Continent Diet (see dc summary)  AMBULATORY STATUS COMMUNICATION OF NEEDS Skin   Limited Assist Verbally Normal                       Personal Care Assistance Level of Assistance  Bathing,Feeding,Dressing Bathing Assistance: Limited assistance Feeding assistance: Independent Dressing Assistance: Limited assistance     Functional Limitations Info  Sight,Hearing,Speech Sight Info: Adequate Hearing Info: Adequate Speech Info: Adequate    SPECIAL CARE FACTORS FREQUENCY  PT (By  licensed PT),OT (By licensed OT)     PT Frequency: 5x week OT Frequency: 3x week            Contractures Contractures Info: Not present    Additional Factors Info  Code Status,Allergies,Psychotropic Code Status Info: Full Allergies Info: Lisinopril Psychotropic Info: Lexapro         Current Medications (04/17/2020):  This is the current hospital active medication list Current Facility-Administered Medications  Medication Dose Route Frequency Provider Last Rate Last Admin  . allopurinol (ZYLOPRIM) tablet 300 mg  300 mg Oral Daily Emokpae, Ejiroghene E, MD   300 mg at 04/17/20 0951  . enoxaparin (LOVENOX) injection 40 mg  40 mg Subcutaneous Q24H Emokpae, Ejiroghene E, MD   40 mg at 04/16/20 2313  . escitalopram (LEXAPRO) tablet 20 mg  20 mg Oral Daily Emokpae, Ejiroghene E, MD   20 mg at 04/17/20 0951  . insulin aspart (novoLOG) injection 0-15 Units  0-15 Units Subcutaneous TID WC Emokpae, Ejiroghene E, MD   3 Units at 04/17/20 1144  . insulin aspart (novoLOG) injection 0-5 Units  0-5 Units Subcutaneous QHS Emokpae, Ejiroghene E, MD   2 Units at 04/16/20 2313  . metoprolol tartrate (LOPRESSOR) tablet 50 mg  50 mg Oral BID Emokpae, Ejiroghene E, MD   50 mg at 04/17/20 0951  . traZODone (DESYREL) tablet 100 mg  100 mg Oral QHS Emokpae, Ejiroghene E, MD   100 mg at 04/16/20 2310     Discharge Medications: Please see discharge summary for a list of discharge medications.  Relevant Imaging Results:  Relevant Lab Results:   Additional Information SSN: [id number]  [name], [place]

## 2020-04-17 NOTE — Progress Notes (Signed)
Transition of Care (TOC) -30 day Note       Patient Details  Name: Kevin Daniel  MRN: [id number] Date of Birth: 05-Aug-1945   Transition of Care [facility]) CM/SW Contact  Name: [name] Phone Number: [phone] Date: 04/17/2020 Time: 2929   MUST ID: [id number]   To Whom it May Concern:   Please be advised that the above patient will require a short-term nursing home stay, anticipated 30 days or less rehabilitation and strengthening. The plan is for return home.

## 2020-04-17 NOTE — Progress Notes (Signed)
Had a conversation with patient about CPAP usage.  Patient admitted that he has had a sleep study and was diagnosed with OSA.  He stated that he used a CPAP for years but began to have issues with using them.  Patient feels that they are a trouble to keep clean etc.  He stated that he felt that when he slept in a chair sitting up, he did not have sleep apnea symptoms.  I informed patient that it was my job to notify him that we do have CPAP machines here for him to use if he would, but patient refused.  No distress was noted, will continue to monitor patient.

## 2020-04-17 NOTE — Progress Notes (Signed)
PROGRESS NOTE    Kevin Daniel  VPX:[id number] DOB: May 17, 1945 DOA: 04/16/2020 PCP: Delorse Limber    Brief Narrative:  74 year old male admitted to the hospital with worsening generalized weakness, lower extremity edema.  He is unable to ambulate without assistance.  Seen by physical therapy with recommendations for skilled nursing facility placement.  Presently, he is stable for discharge once a bed is available   Assessment & Plan:   Principal Problem:   Fall Active Problems:   Chronic fatigue   Type 2 diabetes mellitus with stage 2 chronic kidney disease, with long-term current use of insulin (HCC)   Unspecified atrial fibrillation (HCC)   Pressure injury of skin   Generalized weakness with falls -CT head without acute changes -Seen by physical therapy with recommendations for skilled nursing facility placement  Bilateral lower extremity edema -Echocardiogram shows preserved ejection fraction -Suspect that he has venous stasis -We will apply TED hose -Use diuretics as needed  Atrial fibrillation -Continue metoprolol for rate control -He is not on anticoagulation at this time due to fall risk  Diabetes -On sliding scale insulin -Blood sugars are currently stable  Hypertension -Blood pressure currently stable on metoprolol   DVT prophylaxis: enoxaparin (LOVENOX) injection 40 mg Start: 04/16/20 1930  Code Status: Full code Family Communication: Discussed with wife at the bedside Disposition Plan: Status is: Inpatient  Remains inpatient appropriate because:Unsafe d/c plan   Dispo: The patient is from: Home              Anticipated d/c is to: SNF              Anticipated d/c date is: 1 day              Patient currently is medically stable to d/c.     Consultants:     Procedures:     Antimicrobials:       Subjective: Feels generally weak.  Feels that lower extremity edema is better than it was on  admission  Objective: Vitals:   04/16/20 2126 04/17/20 0648 04/17/20 1401 04/17/20 2116  BP: (!) 141/63 114/68 116/65 135/81  Pulse: 82 68 64 64  Resp: 20 16 16 18   Temp: 98.9 F (37.2 C) 98.5 F (36.9 C) 98.1 F (36.7 C) (!) 97.5 F (36.4 C)  TempSrc: Oral     SpO2: 98% 99% 98% 95%  Weight:      Height:        Intake/Output Summary (Last 24 hours) at 04/17/2020 2157 Last data filed at 04/17/2020 1000 Gross per 24 hour  Intake 250 ml  Output 500 ml  Net -250 ml   Filed Weights   04/16/20 1136 04/16/20 1810  Weight: 93 kg 95.8 kg    Examination:  General exam: Appears calm and comfortable  Respiratory system: Clear to auscultation. Respiratory effort normal. Cardiovascular system: S1 & S2 heard, RRR. No JVD, murmurs, rubs, gallops or clicks. No pedal edema. Gastrointestinal system: Abdomen is nondistended, soft and nontender. No organomegaly or masses felt. Normal bowel sounds heard. Central nervous system: Alert and oriented. No focal neurological deficits. Extremities: 1+ edema lower extremities bilaterally Skin: No rashes, lesions or ulcers Psychiatry: Judgement and insight appear normal. Mood & affect appropriate.     Data Reviewed: I have personally reviewed following labs and imaging studies  CBC: Recent Labs  Lab 04/16/20 1244  WBC 9.2  NEUTROABS 7.2  HGB 13.0  HCT 39.6  MCV 105.0*  PLT 204   Basic  Metabolic Panel: Recent Labs  Lab 04/16/20 1244  NA 133*  K 4.0  CL 96*  CO2 27  GLUCOSE 185*  BUN 18  CREATININE 0.83  CALCIUM 8.9  MG 1.4*   GFR: Estimated Creatinine Clearance: 90.5 mL/min (by C-G formula based on SCr of 0.83 mg/dL). Liver Function Tests: Recent Labs  Lab 04/16/20 1244  AST 19  ALT 17  ALKPHOS 88  BILITOT 0.7  PROT 7.0  ALBUMIN 2.8*   Recent Labs  Lab 04/16/20 1244  LIPASE 22   Recent Labs  Lab 04/16/20 1244  AMMONIA 12   Coagulation Profile: No results for input(s): INR, PROTIME in the last 168  hours. Cardiac Enzymes: No results for input(s): CKTOTAL, CKMB, CKMBINDEX, TROPONINI in the last 168 hours. BNP (last 3 results) No results for input(s): PROBNP in the last 8760 hours. HbA1C: No results for input(s): HGBA1C in the last 72 hours. CBG: Recent Labs  Lab 04/16/20 2308 04/17/20 0735 04/17/20 1113 04/17/20 1642 04/17/20 2116  GLUCAP 225* 161* 170* 127* 159*   Lipid Profile: No results for input(s): CHOL, HDL, LDLCALC, TRIG, CHOLHDL, LDLDIRECT in the last 72 hours. Thyroid Function Tests: Recent Labs    04/16/20 1244  TSH 2.347   Anemia Panel: No results for input(s): VITAMINB12, FOLATE, FERRITIN, TIBC, IRON, RETICCTPCT in the last 72 hours. Sepsis Labs: No results for input(s): PROCALCITON, LATICACIDVEN in the last 168 hours.  Recent Results (from the past 240 hour(s))  Resp Panel by RT-PCR (Flu A&B, Covid) Nasopharyngeal Swab     Status: None   Collection Time: 04/16/20  1:40 PM   Specimen: Nasopharyngeal Swab; Nasopharyngeal(NP) swabs in vial transport medium  Result Value Ref Range Status   SARS Coronavirus 2 by RT PCR NEGATIVE NEGATIVE Final    Comment: (NOTE) SARS-CoV-2 target nucleic acids are NOT DETECTED.  The SARS-CoV-2 RNA is generally detectable in upper respiratory specimens during the acute phase of infection. The lowest concentration of SARS-CoV-2 viral copies this assay can detect is 138 copies/mL. A negative result does not preclude SARS-Cov-2 infection and should not be used as the sole basis for treatment or other patient management decisions. A negative result may occur with  improper specimen collection/handling, submission of specimen other than nasopharyngeal swab, presence of viral mutation(s) within the areas targeted by this assay, and inadequate number of viral copies(<138 copies/mL). A negative result must be combined with clinical observations, patient history, and epidemiological information. The expected result is  Negative.  Fact Sheet for Patients:  EntrepreneurPulse.com.au  Fact Sheet for Healthcare Providers:  IncredibleEmployment.be  This test is no t yet approved or cleared by the [place] FDA and  has been authorized for detection and/or diagnosis of SARS-CoV-2 by FDA under an Emergency Use Authorization (EUA). This EUA will remain  in effect (meaning this test can be used) for the duration of the COVID-19 declaration under Section 564(b)(1) of the Act, 21 U.S.C.section 360bbb-3(b)(1), unless the authorization is terminated  or revoked sooner.       Influenza A by PCR NEGATIVE NEGATIVE Final   Influenza B by PCR NEGATIVE NEGATIVE Final    Comment: (NOTE) The Xpert Xpress SARS-CoV-2/FLU/RSV plus assay is intended as an aid in the diagnosis of influenza from Nasopharyngeal swab specimens and should not be used as a sole basis for treatment. Nasal washings and aspirates are unacceptable for Xpert Xpress SARS-CoV-2/FLU/RSV testing.  Fact Sheet for Patients: EntrepreneurPulse.com.au  Fact Sheet for Healthcare Providers: IncredibleEmployment.be  This test is not yet  approved or cleared by the [place] and has been authorized for detection and/or diagnosis of SARS-CoV-2 by FDA under an Emergency Use Authorization (EUA). This EUA will remain in effect (meaning this test can be used) for the duration of the COVID-19 declaration under Section 564(b)(1) of the Act, 21 U.S.C. section 360bbb-3(b)(1), unless the authorization is terminated or revoked.  Performed at [facility], [address], [place], [place] [address]          Radiology Studies: CT Head Wo Contrast  Result Date: 04/16/2020 CLINICAL DATA:  Minor head trauma EXAM: CT HEAD WITHOUT CONTRAST TECHNIQUE: Contiguous axial images were obtained from the base of the skull through the vertex without intravenous contrast. COMPARISON:  None.  FINDINGS: Brain: Generalized atrophy most prominent the frontal lobes. Negative for hydrocephalus. Mild white matter changes. Chronic infarct right cerebellum. Negative for acute infarct, hemorrhage, mass. Vascular: Negative for hyperdense vessel Skull: Negative Sinuses/Orbits: Mild mucosal edema right maxillary sinus otherwise clear sinuses. Negative orbit. Other: None IMPRESSION: No acute abnormality. Generalized atrophy with mild chronic microvascular ischemic change in the white matter. Chronic infarct right inferior cerebellum. Electronically Signed   By: Franchot Gallo M.D.   On: 04/16/2020 14:29   DG Chest Portable 1 View  Result Date: 04/16/2020 CLINICAL DATA:  History of generalized weakness in a 74 year old male EXAM: PORTABLE CHEST 1 VIEW COMPARISON:  None FINDINGS: Median sternotomy. Heart size is enlarged and accentuated by portable technique. Minimal opacities at the LEFT lung base. Vascular congestion without frank edema. No gross effusion. On limited assessment no acute skeletal process. IMPRESSION: 1. Minimal opacities at the LEFT lung base may represent atelectasis versus developing infection. 2. Vascular congestion without frank edema. 3. Post median sternotomy with cardiac enlargement. Electronically Signed   By: Zetta Bills M.D.   On: 04/16/2020 12:59   ECHOCARDIOGRAM COMPLETE  Result Date: 04/17/2020    ECHOCARDIOGRAM REPORT   Patient Name:   Kevin Daniel Date of Exam: 04/17/2020 Medical Rec #:  [id number]            Height:       69.0 in Accession #:    [id number]           Weight:       211.2 lb Date of Birth:  1946-02-15           BSA:          2.114 m Patient Age:    64 years             BP:           114/68 mmHg Patient Gender: M                    HR:           68 bpm. Exam Location:  [facility] Procedure: 2D Echo Indications:    Leg swelling [[id number]  History:        Patient has no prior history of Echocardiogram examinations.                 Stroke, Arrythmias:Atrial  Fibrillation; Risk Factors:Non-Smoker,                 Diabetes, Dyslipidemia and Hypertension. Post-CPB: s/p MAZE and                 CABG 2010. Per patient LAA removed.  Sonographer:    Leavy Cella RDCS (AE) Referring Phys: [place]  1. Left ventricular ejection fraction, by estimation, is 60 to 65%. The left ventricle has normal function. The left ventricle has no regional wall motion abnormalities. There is mild left ventricular hypertrophy. Left ventricular diastolic parameters are indeterminate. Elevated left atrial pressure.  2. Right ventricular systolic function was not well visualized. The right ventricular size is not well visualized.  3. Left atrial size was severely dilated.  4. Right atrial size was severely dilated.  5. The mitral valve is normal in structure. Trivial mitral valve regurgitation. No evidence of mitral stenosis.  6. The aortic valve has an indeterminant number of cusps. There is mild calcification of the aortic valve. There is mild thickening of the aortic valve. Aortic valve regurgitation is not visualized. No aortic stenosis is present.  7. The inferior vena cava is normal in size with greater than 50% respiratory variability, suggesting right atrial pressure of 3 mmHg. FINDINGS  Left Ventricle: Left ventricular ejection fraction, by estimation, is 60 to 65%. The left ventricle has normal function. The left ventricle has no regional wall motion abnormalities. The left ventricular internal cavity size was normal in size. There is  mild left ventricular hypertrophy. Left ventricular diastolic parameters are indeterminate. Elevated left atrial pressure. Right Ventricle: The right ventricular size is not well visualized. Right vetricular wall thickness was not well visualized. Right ventricular systolic function was not well visualized. Left Atrium: Left atrial size was severely dilated. Right Atrium: Right atrial size was severely dilated. Pericardium:  There is no evidence of pericardial effusion. Mitral Valve: The mitral valve is normal in structure. Trivial mitral valve regurgitation. No evidence of mitral valve stenosis. Tricuspid Valve: The tricuspid valve is normal in structure. Tricuspid valve regurgitation is mild . No evidence of tricuspid stenosis. Aortic Valve: The aortic valve has an indeterminant number of cusps. There is mild calcification of the aortic valve. There is mild thickening of the aortic valve. There is mild aortic valve annular calcification. Aortic valve regurgitation is not visualized. No aortic stenosis is present. Aortic valve mean gradient measures 2.5 mmHg. Aortic valve peak gradient measures 4.9 mmHg. Aortic valve area, by VTI measures 2.24 cm. Pulmonic Valve: The pulmonic valve was not well visualized. Pulmonic valve regurgitation is not visualized. No evidence of pulmonic stenosis. Aorta: The aortic root is normal in size and structure. Pulmonary Artery: Indeterminant PASP, inadequate TR jet. Venous: The inferior vena cava is normal in size with greater than 50% respiratory variability, suggesting right atrial pressure of 3 mmHg. IAS/Shunts: No atrial level shunt detected by color flow Doppler.  LEFT VENTRICLE PLAX 2D LVIDd:         4.43 cm  Diastology LVIDs:         2.90 cm  LV e' medial:    4.90 cm/s LV PW:         1.33 cm  LV E/e' medial:  36.9 LV IVS:        1.10 cm  LV e' lateral:   10.60 cm/s LVOT diam:     2.10 cm  LV E/e' lateral: 17.1 LV SV:         52 LV SV Index:   24 LVOT Area:     3.46 cm  RIGHT VENTRICLE RV S prime:     7.18 cm/s TAPSE (M-mode): 1.4 cm LEFT ATRIUM              Index       RIGHT ATRIUM  Index LA diam:        4.90 cm  2.32 cm/m  RA Area:     33.50 cm LA Vol (A2C):   124.0 ml 58.65 ml/m RA Volume:   117.00 ml 55.33 ml/m LA Vol (A4C):   92.9 ml  43.94 ml/m LA Biplane Vol: 107.0 ml 50.61 ml/m  AORTIC VALVE AV Area (Vmax):    2.75 cm AV Area (Vmean):   2.22 cm AV Area (VTI):     2.24 cm  AV Vmax:           110.76 cm/s AV Vmean:          73.475 cm/s AV VTI:            0.230 m AV Peak Grad:      4.9 mmHg AV Mean Grad:      2.5 mmHg LVOT Vmax:         87.78 cm/s LVOT Vmean:        47.054 cm/s LVOT VTI:          0.149 m LVOT/AV VTI ratio: 0.65  AORTA Ao Root diam: 3.30 cm MITRAL VALVE                TRICUSPID VALVE MV Area (PHT): 4.41 cm     TR Peak grad:   25.4 mmHg MV Decel Time: 172 msec     TR Vmax:        252.00 cm/s MV E velocity: 181.00 cm/s MV A velocity: 46.00 cm/s   SHUNTS MV E/A ratio:  3.93         Systemic VTI:  0.15 m                             Systemic Diam: 2.10 cm Carlyle Dolly MD Electronically signed by Carlyle Dolly MD Signature Date/Time: 04/17/2020/11:07:03 AM    Final         Scheduled Meds: . allopurinol  300 mg Oral Daily  . enoxaparin (LOVENOX) injection  40 mg Subcutaneous Q24H  . escitalopram  20 mg Oral Daily  . insulin aspart  0-15 Units Subcutaneous TID WC  . insulin aspart  0-5 Units Subcutaneous QHS  . metoprolol tartrate  50 mg Oral BID  . traZODone  100 mg Oral QHS   Continuous Infusions:   LOS: 0 days    Time spent: 79mins    Kathie Dike, MD [facility]   If 7PM-7AM, please contact night-coverage [email or web address]  04/17/2020, 9:57 PM

## 2020-04-17 NOTE — TOC Initial Note (Signed)
Transition of Care [facility]) - Initial/Assessment Note    Patient Details  Name: Kevin Daniel MRN: [id number] Date of Birth: [birth year]  [facility]) CM/SW Contact:    [name], LCSW Phone Number: 04/17/2020, 2:27 PM  Clinical Narrative:                  Pt admitted from home. Pt was recently discharged home from the hospital with [facility] RN and PT. At this time, PT recommending SNF. Spoke with pt and his wife to discuss. They are agreeable and CMS provider options were discussed with them. Will refer as requested. Per MD, pt stable for dc when SNF bed found.  TOC will follow.  Expected Discharge Plan: Skilled Nursing Facility Barriers to Discharge: SNF Pending bed offer   Patient Goals and CMS Choice Patient states their goals for this hospitalization and ongoing recovery are:: get better CMS Medicare.gov Compare Post Acute Care list provided to:: Patient Represenative (must comment) Choice offered to / list presented to : Spouse,Patient  Expected Discharge Plan and Services Expected Discharge Plan: [place] In-house Referral: Clinical Social Work   Post Acute Care Choice: [place] Living arrangements for the past 2 months: Mobile Home                                      Prior Living Arrangements/Services Living arrangements for the past 2 months: Mobile Home Lives with:: Spouse Patient language and need for interpreter reviewed:: Yes Do you feel safe going back to the place where you live?: Yes      Need for Family Participation in Patient Care: Yes (Comment) Care giver support system in place?: Yes (comment) Current home services: Home PT Criminal Activity/Legal Involvement Pertinent to Current Situation/Hospitalization: No - Comment as needed  Activities of Daily Living Home Assistive Devices/Equipment: Eyeglasses,CBG Meter ADL Screening (condition at time of admission) Patient's cognitive ability  adequate to safely complete daily activities?: No Is the patient deaf or have difficulty hearing?: No Does the patient have difficulty seeing, even when wearing glasses/contacts?: No Does the patient have difficulty concentrating, remembering, or making decisions?: No Patient able to express need for assistance with ADLs?: Yes Does the patient have difficulty dressing or bathing?: Yes Independently performs ADLs?: No Communication: Independent Dressing (OT): Needs assistance Is this a change from baseline?: Pre-admission baseline Grooming: Needs assistance Is this a change from baseline?: Pre-admission baseline Feeding: Independent Bathing: Needs assistance Is this a change from baseline?: Pre-admission baseline Toileting: Needs assistance Is this a change from baseline?: Pre-admission baseline In/Out Bed: Needs assistance Is this a change from baseline?: Pre-admission baseline Walks in Home: Needs assistance Is this a change from baseline?: Pre-admission baseline Does the patient have difficulty walking or climbing stairs?: Yes Weakness of Legs: Both Weakness of Arms/Hands: Both  Permission Sought/Granted Permission sought to share information with : Chartered certified accountant granted to share information with : Yes, Verbal Permission Granted     Permission granted to share info w AGENCY: [facility]        Emotional Assessment   Attitude/Demeanor/Rapport: Engaged Affect (typically observed): Flat Orientation: : Oriented to Place,Oriented to  Time,Oriented to Situation,Oriented to Self Alcohol / Substance Use: Not Applicable Psych Involvement: No (comment)  Admission diagnosis:  Fall [W19.XXXA] Gait instability [R26.81] Elevated brain natriuretic peptide (BNP) level [R79.89] Generalized weakness [R53.1] Pulmonary vascular congestion [R09.89] Fall, initial encounter [  W19.XXXA] Patient Active Problem List   Diagnosis Date Noted  . Fall 04/16/2020  .  Unspecified atrial fibrillation ([place]) 04/16/2020  . Pressure injury of skin 04/16/2020  . Pressure injury of skin of sacral region 10/03/2019  . Chronic diarrhea 10/03/2019  . Chronic fatigue 10/03/2019  . Type 2 diabetes mellitus with stage 2 chronic kidney disease, with long-term current use of insulin ([place]) 10/03/2019  . Hypertension associated with diabetes ([place]) 10/03/2019  . Hyperlipidemia associated with type 2 diabetes mellitus ([place]) 10/03/2019   PCP:  Brunetta Jeans, PA-C Pharmacy:   [id number] - [place], [place] - [address] [place] [address] Phone: [phone] Fax: [phone]     Social Determinants of Health (SDOH) Interventions    Readmission Risk Interventions No flowsheet data found.

## 2020-04-17 NOTE — Plan of Care (Signed)

## 2020-04-17 NOTE — Progress Notes (Signed)
*  PRELIMINARY RESULTS* Echocardiogram 2D Echocardiogram has been performed.  Kevin Daniel 04/17/2020, 9:28 AM

## 2020-04-17 NOTE — Evaluation (Signed)
Physical Therapy Evaluation Patient Details Name: Jessiah Steinhart MRN: [id number] DOB: 09-Oct-1945 Today's Date: 04/17/2020   History of Present Illness  Kevin Daniel is a 74 y.o. male with medical history significant for atrial fibrillation, hypertension, OSA, diabetes mellitus.  Patient presented to the ED with complaints of generalized weakness of 2 weeks, with bilateral lower extremity swelling.  Patient reports that he cannot ambulate at home.  In the past month, his second fall was 12/9, EMS was called, the patient did not want to come to the ED.  Patient reports bilateral lower extremity swelling over the past few weeks.  He denies abdominal bloating, he denies difficulty breathing, no chest pain.  He has a chronic unchanged cough.    He tells me his normal weight is about 225 pounds, and he has actually lost weight, now weighing 205 pounds.  Patient lives with his spouse in a motor home.  He previously used to walk without assistance, but with onset of weakness, he has barely been able to ambulate at all over the past month.  Patient would like some rehab therapy.    Clinical Impression  Patient demonstrates slow labored movement for sitting up at bedside requiring verbal/tactile cueing for proper hand placement with fair carryover, slightly unsteady on feet during ambulation without loss of balance, limited mostly due to fatigue and tolerated sitting up in chair after therapy.  Patient will benefit from continued physical therapy in hospital and recommended venue below to increase strength, balance, endurance for safe ADLs and gait.     Follow Up Recommendations SNF    Equipment Recommendations  None recommended by PT    Recommendations for Other Services       Precautions / Restrictions Precautions Precautions: Fall Restrictions Weight Bearing Restrictions: No      Mobility  Bed Mobility Overal bed mobility: Needs Assistance Bed Mobility: Supine to Sit      Supine to sit: Mod assist;Min assist     General bed mobility comments: slow labored movement    Transfers Overall transfer level: Needs assistance Equipment used: Rolling walker (2 wheeled) Transfers: Sit to/from [facility] Sit to Stand: Min guard;Min assist Stand pivot transfers: Min assist;Min guard       General transfer comment: increased time, labored movement  Ambulation/Gait Ambulation/Gait assistance: Min assist Gait Distance (Feet): 35 Feet Assistive device: Rolling walker (2 wheeled) Gait Pattern/deviations: Decreased step length - right;Decreased step length - left;Decreased stride length Gait velocity: decreased   General Gait Details: slow labored cadence without loss of balance, limited mostly due to fatigue  Stairs            Wheelchair Mobility    Modified Rankin (Stroke Patients Only)       Balance Overall balance assessment: Needs assistance Sitting-balance support: Feet supported;No upper extremity supported Sitting balance-Leahy Scale: Good Sitting balance - Comments: seated at EOB   Standing balance support: During functional activity;Bilateral upper extremity supported Standing balance-Leahy Scale: Fair Standing balance comment: using RW                             Pertinent Vitals/Pain Pain Assessment: No/denies pain    Home Living Family/patient expects to be discharged to:: Private residence Living Arrangements: Spouse/significant other Available Help at Discharge: Family;Available 24 hours/day Type of Home: Mobile home Home Access: Stairs to enter Entrance Stairs-Rails: Right Entrance Stairs-Number of Steps: 5-6 Home Layout: One level Home Equipment: Walker - 2  wheels;Cane - quad;Walker - 4 wheels;Tub bench      Prior Function Level of Independence: Independent with assistive device(s)         Comments: household ambulator using quad-cane     Hand Dominance        Extremity/Trunk  Assessment   Upper Extremity Assessment Upper Extremity Assessment: Generalized weakness    Lower Extremity Assessment Lower Extremity Assessment: Generalized weakness    Cervical / Trunk Assessment Cervical / Trunk Assessment: Normal  Communication   Communication: No difficulties  Cognition Arousal/Alertness: Awake/alert Behavior During Therapy: WFL for tasks assessed/performed Overall Cognitive Status: Within Functional Limits for tasks assessed                                        General Comments      Exercises     Assessment/Plan    PT Assessment Patient needs continued PT services  PT Problem List Decreased strength;Decreased activity tolerance;Decreased mobility       PT Treatment Interventions DME instruction;Balance training;Gait training;Stair training;Functional mobility training;Therapeutic activities;Patient/family education;Therapeutic exercise    PT Goals (Current goals can be found in the Care Plan section)  Acute Rehab PT Goals Patient Stated Goal: return home after rehab PT Goal Formulation: With patient/family Time For Goal Achievement: 05/01/20 Potential to Achieve Goals: Good    Frequency Min 3X/week   Barriers to discharge        Co-evaluation               AM-PAC PT "6 Clicks" Mobility  Outcome Measure Help needed turning from your back to your side while in a flat bed without using bedrails?: A Little Help needed moving from lying on your back to sitting on the side of a flat bed without using bedrails?: A Lot Help needed moving to and from a bed to a chair (including a wheelchair)?: A Little Help needed standing up from a chair using your arms (e.g., wheelchair or bedside chair)?: A Little Help needed to walk in hospital room?: A Little Help needed climbing 3-5 steps with a railing? : A Lot 6 Click Score: 16    End of Session   Activity Tolerance: Patient tolerated treatment well;Patient limited by  fatigue Patient left: in chair;with call bell/phone within reach;with family/visitor present Nurse Communication: Mobility status PT Visit Diagnosis: Unsteadiness on feet (R26.81);Other abnormalities of gait and mobility (R26.89);Muscle weakness (generalized) (M62.81)    Time: 1275-1700 PT Time Calculation (min) (ACUTE ONLY): 25 min   Charges:   PT Evaluation $PT Eval Moderate Complexity: 1 Mod PT Treatments $Therapeutic Activity: 23-37 mins        3:17 PM, 04/17/20 Lonell Grandchild, MPT Physical Therapist with [facility] 336 [phone] office [phone] mobile phone

## 2020-04-17 NOTE — Plan of Care (Signed)
  Problem: Acute Rehab PT Goals(only PT should resolve) Goal: Pt Will Go Supine/Side To Sit Outcome: Progressing Flowsheets (Taken 04/17/2020 1518) Pt will go Supine/Side to Sit: with min guard assist Goal: Patient Will Transfer Sit To/From Stand Outcome: Progressing Flowsheets (Taken 04/17/2020 1518) Patient will transfer sit to/from stand: with min guard assist Goal: Pt Will Transfer Bed To Chair/Chair To Bed Outcome: Progressing Flowsheets (Taken 04/17/2020 1518) Pt will Transfer Bed to Chair/Chair to Bed:  min guard assist  with min assist Goal: Pt Will Ambulate Outcome: Progressing Flowsheets (Taken 04/17/2020 1518) Pt will Ambulate:  75 feet  with min guard assist  with minimal assist  with rolling walker   3:19 PM, 04/17/20 Lonell Grandchild, MPT Physical Therapist with [facility] 336 [phone] office [phone] mobile phone

## 2020-04-18 ENCOUNTER — Telehealth: Payer: Self-pay | Admitting: Physician Assistant

## 2020-04-18 DIAGNOSIS — Z794 Long term (current) use of insulin: Secondary | ICD-10-CM

## 2020-04-18 DIAGNOSIS — E1122 Type 2 diabetes mellitus with diabetic chronic kidney disease: Secondary | ICD-10-CM | POA: Diagnosis not present

## 2020-04-18 DIAGNOSIS — Z9181 History of falling: Secondary | ICD-10-CM

## 2020-04-18 DIAGNOSIS — R531 Weakness: Secondary | ICD-10-CM

## 2020-04-18 DIAGNOSIS — E785 Hyperlipidemia, unspecified: Secondary | ICD-10-CM

## 2020-04-18 DIAGNOSIS — I129 Hypertensive chronic kidney disease with stage 1 through stage 4 chronic kidney disease, or unspecified chronic kidney disease: Secondary | ICD-10-CM | POA: Diagnosis not present

## 2020-04-18 DIAGNOSIS — N182 Chronic kidney disease, stage 2 (mild): Secondary | ICD-10-CM | POA: Diagnosis not present

## 2020-04-18 DIAGNOSIS — I5031 Acute diastolic (congestive) heart failure: Secondary | ICD-10-CM

## 2020-04-18 DIAGNOSIS — E1140 Type 2 diabetes mellitus with diabetic neuropathy, unspecified: Secondary | ICD-10-CM | POA: Diagnosis not present

## 2020-04-18 LAB — FOLATE: Folate: 8.5 ng/mL (ref >5.9)

## 2020-04-18 LAB — PROTEIN / CREATININE RATIO, URINE
Creatinine, Urine: 188.58 mg/dL
Protein Creatinine Ratio: 0.07 mg/mg{Cre} (ref 0.00–0.15)
Total Protein, Urine: 14 mg/dL

## 2020-04-18 LAB — CBC
HCT: 38.2 % — ABNORMAL LOW (ref 39.0–52.0)
Hemoglobin: 12.1 g/dL — ABNORMAL LOW (ref 13.0–17.0)
MCH: 34 pg (ref 26.0–34.0)
MCHC: 31.7 g/dL (ref 30.0–36.0)
MCV: 107.3 fL — ABNORMAL HIGH (ref 80.0–100.0)
Platelets: 200 10*3/uL (ref 150–400)
RBC: 3.56 MIL/uL — ABNORMAL LOW (ref 4.22–5.81)
RDW: 14.5 % (ref 11.5–15.5)
WBC: 7.1 10*3/uL (ref 4.0–10.5)
nRBC: 0 % (ref 0.0–0.2)

## 2020-04-18 LAB — GLUCOSE, CAPILLARY
Glucose-Capillary: 154 mg/dL — ABNORMAL HIGH (ref 70–99)
Glucose-Capillary: 156 mg/dL — ABNORMAL HIGH (ref 70–99)
Glucose-Capillary: 184 mg/dL — ABNORMAL HIGH (ref 70–99)
Glucose-Capillary: 134 mg/dL — ABNORMAL HIGH (ref 70–99)

## 2020-04-18 LAB — T4, FREE: Free T4: 0.94 ng/dL (ref 0.61–1.12)

## 2020-04-18 LAB — VITAMIN B12: Vitamin B-12: 405 pg/mL (ref 180–914)

## 2020-04-18 LAB — COMPREHENSIVE METABOLIC PANEL
ALT: 14 U/L (ref 0–44)
AST: 18 U/L (ref 15–41)
Albumin: 2.7 g/dL — ABNORMAL LOW (ref 3.5–5.0)
Alkaline Phosphatase: 74 U/L (ref 38–126)
Anion gap: 10 (ref 5–15)
BUN: 15 mg/dL (ref 8–23)
CO2: 30 mmol/L (ref 22–32)
Calcium: 8.8 mg/dL — ABNORMAL LOW (ref 8.9–10.3)
Chloride: 97 mmol/L — ABNORMAL LOW (ref 98–111)
Creatinine, Ser: 0.84 mg/dL (ref 0.61–1.24)
GFR, Estimated: >60 mL/min (ref >60)
Glucose, Bld: 153 mg/dL — ABNORMAL HIGH (ref 70–99)
Potassium: 4.3 mmol/L (ref 3.5–5.1)
Sodium: 137 mmol/L (ref 135–145)
Total Bilirubin: 0.9 mg/dL (ref 0.3–1.2)
Total Protein: 6.6 g/dL (ref 6.5–8.1)

## 2020-04-18 LAB — HEMOGLOBIN A1C
Hgb A1c MFr Bld: 6.7 % — ABNORMAL HIGH (ref 4.8–5.6)
Mean Plasma Glucose: 145.59 mg/dL

## 2020-04-18 LAB — TSH: TSH: 2.37 u[IU]/mL (ref 0.350–4.500)

## 2020-04-18 MED ORDER — FUROSEMIDE 40 MG PO TABS
40.0000 mg | ORAL_TABLET | Freq: Once | ORAL | Status: AC
  Administered 2020-04-18: 40 mg via ORAL
  Filled 2020-04-18: qty 1

## 2020-04-18 MED ORDER — INFLUENZA VAC A&B SA ADJ QUAD 0.5 ML IM PRSY
0.5000 mL | PREFILLED_SYRINGE | INTRAMUSCULAR | Status: AC
  Administered 2020-04-19: 0.5 mL via INTRAMUSCULAR
  Filled 2020-04-18: qty 0.5

## 2020-04-18 NOTE — Telephone Encounter (Signed)
Paperwork faxed and charge sheet given to front desk

## 2020-04-18 NOTE — TOC Progression Note (Signed)
Transition of Care [facility]) - Progression Note    Patient Details  Name: Kevin Daniel MRN: [id number] Date of Birth: 02/15/1946  Transition of Care [facility]) CM/SW Contact  [name], LCSW Phone Number: 04/18/2020, 10:46 AM  Clinical Narrative:     TOC following. Pt status discussed with MD in Progression rounds today. Per MD, he is anticipating pt will be medically stable for dc tomorrow. Spoke with pt's wife to update on SNF bed offers. 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 Updated Graceanne at [facility] and they can take pt tomorrow.   TOC will follow.  Expected Discharge Plan: Skilled Nursing Facility Barriers to Discharge: SNF Pending bed offer  Expected Discharge Plan and Services Expected Discharge Plan: [place] In-house Referral: Clinical Social Work   Post Acute Care Choice: [place] Living arrangements for the past 2 months: Mobile Home                                       Social Determinants of Health (SDOH) Interventions    Readmission Risk Interventions No flowsheet data found.

## 2020-04-18 NOTE — Telephone Encounter (Signed)
Placed home health form up front in Cody's bin.

## 2020-04-18 NOTE — Progress Notes (Signed)
Patient does not use CPAP at home and has been refusing here at hospital.  No distress noted at this time.

## 2020-04-18 NOTE — Progress Notes (Signed)
PROGRESS NOTE  Kevin Daniel MKL:[id number] DOB: 18-Aug-1945 DOA: 04/16/2020 PCP: Brunetta Jeans, PA-C  Brief History:  74 y.o. male with medical history significant for atrial fibrillation, hypertension, OSA, diabetes mellitus type 2. Patient presented to the ED with complaints of generalized weakness of 2 weeks, with bilateral lower extremity swelling over the past 3 to 4 months..  Patient reports increasing generalized weakness and falling for the last 3 to 4 months. He has had to sleep in a recliner for that period of time. He feels that he is actually lost some weight, but he is trying to lose weight. He states that he is down to a 5 pounds. He denies any fever, chills, chest pain, nausea, vomiting, diarrhea, abdominal pain. In the emergency department, the patient was afebrile hemodynamically stable with oxygen saturation 94% on room air. Chest x-ray showed increased vascular congestion. BNP was 149.0. The patient was given furosemide 40 mg IV x1  Assessment/Plan: Acute diastolic CHF -Significant improvement with one dose of IV furosemide -Continue furosemide p.o. -Accurate I's and O's -04/17/2020 echo--EF 60-65%, no WMA, trivial MR  Lower extremity edema -Venous duplex -Urine protein creatinine ratio  Hypomagnesemia -Replete  Atrial fibrillation, type unspecified -CHADS-VASc = 6 (CHF, HTN, Age, DM2, Stroke) -not on AC due to frequent falls -start ASA 81 mg -Continue metoprolol for rate control  Diabetes mellitus type 2 -04/18/2020 hemoglobin A1c 6.7 -NovoLog sliding scale  Essential hypertension -Holding chlorthalidone -Continue metoprolol  Generalized weakness -PT eval-->SNF -Serum W97--948 -Folic acid 8.5 -UA no pyuria -TSH 2.370  Stage 2 sacral ulcer -present on admission -continue local care    Status is: Inpatient  Remains inpatient appropriate because:IV treatments appropriate due to intensity of illness or inability to take  PO   Dispo: The patient is from: Home              Anticipated d/c is to: SNF              Anticipated d/c date is: 1 day              Patient currently is not medically stable to d/c.        Family Communication:   Spouse updated at bedside 12/15  Consultants:  none  Code Status:  FULL   DVT Prophylaxis:  [place] Lovenox   Procedures: As Listed in Progress Note Above  Antibiotics: None   Total time spent 35 minutes.  Greater than 50% spent face to face counseling and coordinating care.     Subjective: Patient denies fevers, chills, headache, chest pain, dyspnea, nausea, vomiting, diarrhea, abdominal pain, dysuria, hematuria, hematochezia, and melena.   Objective: Vitals:   04/17/20 0648 04/17/20 1401 04/17/20 2116 04/18/20 0351  BP: 114/68 116/65 135/81 109/67  Pulse: 68 64 64 (!) 56  Resp: 16 16 18 20   Temp: 98.5 F (36.9 C) 98.1 F (36.7 C) (!) 97.5 F (36.4 C) 98.6 F (37 C)  TempSrc:    Oral  SpO2: 99% 98% 95% 99%  Weight:      Height:        Intake/Output Summary (Last 24 hours) at 04/18/2020 1527 Last data filed at 04/18/2020 1358 Gross per 24 hour  Intake 480 ml  Output 800 ml  Net -320 ml   Weight change:  Exam:   General:  Pt is alert, follows commands appropriately, not in acute distress  HEENT: No icterus, No thrush, No neck mass, [place]/AT  Cardiovascular: RRR, S1/S2, no rubs, no gallops  Respiratory: bibasilar crackles. No wheeze  Abdomen: Soft/+BS, non tender, non distended, no guarding  Extremities: 1+LE edema, No lymphangitis, No petechiae, No rashes, no synovitis   Data Reviewed: I have personally reviewed following labs and imaging studies Basic Metabolic Panel: Recent Labs  Lab 04/16/20 1244 04/17/20 2139 04/18/20 0521  NA 133* 135 137  K 4.0 3.4* 4.3  CL 96* 97* 97*  CO2 27 28 30   GLUCOSE 185* 173* 153*  BUN 18 17 15   CREATININE 0.83 0.89 0.84  CALCIUM 8.9 8.6* 8.8*  MG 1.4*  --   --    Liver Function  Tests: Recent Labs  Lab 04/16/20 1244 04/18/20 0521  AST 19 18  ALT 17 14  ALKPHOS 88 74  BILITOT 0.7 0.9  PROT 7.0 6.6  ALBUMIN 2.8* 2.7*   Recent Labs  Lab 04/16/20 1244  LIPASE 22   Recent Labs  Lab 04/16/20 1244  AMMONIA 12   Coagulation Profile: No results for input(s): INR, PROTIME in the last 168 hours. CBC: Recent Labs  Lab 04/16/20 1244 04/18/20 0521  WBC 9.2 7.1  NEUTROABS 7.2  --   HGB 13.0 12.1*  HCT 39.6 38.2*  MCV 105.0* 107.3*  PLT 204 200   Cardiac Enzymes: No results for input(s): CKTOTAL, CKMB, CKMBINDEX, TROPONINI in the last 168 hours. BNP: Invalid input(s): POCBNP CBG: Recent Labs  Lab 04/17/20 1113 04/17/20 1642 04/17/20 2116 04/18/20 0817 04/18/20 1133  GLUCAP 170* 127* 159* 154* 156*   HbA1C: Recent Labs    04/18/20 0702  HGBA1C 6.7*   Urine analysis:    Component Value Date/Time   COLORURINE YELLOW 04/16/2020 [place] 04/16/2020 1223   LABSPEC >1.030 (H) 04/16/2020 1223   PHURINE 5.0 04/16/2020 1223   GLUCOSEU NEGATIVE 04/16/2020 1223   HGBUR NEGATIVE 04/16/2020 1223   BILIRUBINUR SMALL (A) 04/16/2020 1223   KETONESUR NEGATIVE 04/16/2020 1223   PROTEINUR NEGATIVE 04/16/2020 1223   NITRITE NEGATIVE 04/16/2020 1223   LEUKOCYTESUR NEGATIVE 04/16/2020 1223   Sepsis Labs: @LABRCNTIP (procalcitonin:4,lacticidven:4) ) Recent Results (from the past 240 hour(s))  Resp Panel by RT-PCR (Flu A&B, Covid) Nasopharyngeal Swab     Status: None   Collection Time: 04/16/20  1:40 PM   Specimen: Nasopharyngeal Swab; Nasopharyngeal(NP) swabs in vial transport medium  Result Value Ref Range Status   SARS Coronavirus 2 by RT PCR NEGATIVE NEGATIVE Final    Comment: (NOTE) SARS-CoV-2 target nucleic acids are NOT DETECTED.  The SARS-CoV-2 RNA is generally detectable in upper respiratory specimens during the acute phase of infection. The lowest concentration of SARS-CoV-2 viral copies this assay can detect is 138  copies/mL. A negative result does not preclude SARS-Cov-2 infection and should not be used as the sole basis for treatment or other patient management decisions. A negative result may occur with  improper specimen collection/handling, submission of specimen other than nasopharyngeal swab, presence of viral mutation(s) within the areas targeted by this assay, and inadequate number of viral copies(<138 copies/mL). A negative result must be combined with clinical observations, patient history, and epidemiological information. The expected result is Negative.  Fact Sheet for Patients:  EntrepreneurPulse.com.au  Fact Sheet for Healthcare Providers:  IncredibleEmployment.be  This test is no t yet approved or cleared by the [place] FDA and  has been authorized for detection and/or diagnosis of SARS-CoV-2 by FDA under an Emergency Use Authorization (EUA). This EUA will remain  in effect (meaning this test can  be used) for the duration of the COVID-19 declaration under Section 564(b)(1) of the Act, 21 U.S.C.section 360bbb-3(b)(1), unless the authorization is terminated  or revoked sooner.       Influenza A by PCR NEGATIVE NEGATIVE Final   Influenza B by PCR NEGATIVE NEGATIVE Final    Comment: (NOTE) The Xpert Xpress SARS-CoV-2/FLU/RSV plus assay is intended as an aid in the diagnosis of influenza from Nasopharyngeal swab specimens and should not be used as a sole basis for treatment. Nasal washings and aspirates are unacceptable for Xpert Xpress SARS-CoV-2/FLU/RSV testing.  Fact Sheet for Patients: EntrepreneurPulse.com.au  Fact Sheet for Healthcare Providers: IncredibleEmployment.be  This test is not yet approved or cleared by the [place] FDA and has been authorized for detection and/or diagnosis of SARS-CoV-2 by FDA under an Emergency Use Authorization (EUA). This EUA will remain in effect (meaning  this test can be used) for the duration of the COVID-19 declaration under Section 564(b)(1) of the Act, 21 U.S.C. section 360bbb-3(b)(1), unless the authorization is terminated or revoked.  Performed at [facility], [address], [place], [place] [address]      Scheduled Meds: . allopurinol  300 mg Oral Daily  . enoxaparin (LOVENOX) injection  40 mg Subcutaneous Q24H  . escitalopram  20 mg Oral Daily  . [START ON 04/19/2020] influenza vaccine adjuvanted  0.5 mL Intramuscular Tomorrow-1000  . insulin aspart  0-15 Units Subcutaneous TID WC  . insulin aspart  0-5 Units Subcutaneous QHS  . metoprolol tartrate  50 mg Oral BID  . traZODone  100 mg Oral QHS   Continuous Infusions:  Procedures/Studies: CT Head Wo Contrast  Result Date: 04/16/2020 CLINICAL DATA:  Minor head trauma EXAM: CT HEAD WITHOUT CONTRAST TECHNIQUE: Contiguous axial images were obtained from the base of the skull through the vertex without intravenous contrast. COMPARISON:  None. FINDINGS: Brain: Generalized atrophy most prominent the frontal lobes. Negative for hydrocephalus. Mild white matter changes. Chronic infarct right cerebellum. Negative for acute infarct, hemorrhage, mass. Vascular: Negative for hyperdense vessel Skull: Negative Sinuses/Orbits: Mild mucosal edema right maxillary sinus otherwise clear sinuses. Negative orbit. Other: None IMPRESSION: No acute abnormality. Generalized atrophy with mild chronic microvascular ischemic change in the white matter. Chronic infarct right inferior cerebellum. Electronically Signed   By: Franchot Gallo M.D.   On: 04/16/2020 14:29   DG Chest Portable 1 View  Result Date: 04/16/2020 CLINICAL DATA:  History of generalized weakness in a 74 year old male EXAM: PORTABLE CHEST 1 VIEW COMPARISON:  None FINDINGS: Median sternotomy. Heart size is enlarged and accentuated by portable technique. Minimal opacities at the LEFT lung base. Vascular congestion without frank edema. No gross  effusion. On limited assessment no acute skeletal process. IMPRESSION: 1. Minimal opacities at the LEFT lung base may represent atelectasis versus developing infection. 2. Vascular congestion without frank edema. 3. Post median sternotomy with cardiac enlargement. Electronically Signed   By: Zetta Bills M.D.   On: 04/16/2020 12:59   ECHOCARDIOGRAM COMPLETE  Result Date: 04/17/2020    ECHOCARDIOGRAM REPORT   Patient Name:   Kevin Daniel Date of Exam: 04/17/2020 Medical Rec #:  [id number]            Height:       69.0 in Accession #:    [id number]           Weight:       211.2 lb Date of Birth:  08/02/1945           BSA:  2.114 m Patient Age:    43 years             BP:           114/68 mmHg Patient Gender: M                    HR:           68 bpm. Exam Location:  [facility] Procedure: 2D Echo Indications:    Leg swelling [[id number]  History:        Patient has no prior history of Echocardiogram examinations.                 Stroke, Arrythmias:Atrial Fibrillation; Risk Factors:Non-Smoker,                 Diabetes, Dyslipidemia and Hypertension. Post-CPB: s/p MAZE and                 CABG 2010. Per patient LAA removed.  Sonographer:    Leavy Cella RDCS (AE) Referring Phys: [place]  1. Left ventricular ejection fraction, by estimation, is 60 to 65%. The left ventricle has normal function. The left ventricle has no regional wall motion abnormalities. There is mild left ventricular hypertrophy. Left ventricular diastolic parameters are indeterminate. Elevated left atrial pressure.  2. Right ventricular systolic function was not well visualized. The right ventricular size is not well visualized.  3. Left atrial size was severely dilated.  4. Right atrial size was severely dilated.  5. The mitral valve is normal in structure. Trivial mitral valve regurgitation. No evidence of mitral stenosis.  6. The aortic valve has an indeterminant number of cusps. There is mild  calcification of the aortic valve. There is mild thickening of the aortic valve. Aortic valve regurgitation is not visualized. No aortic stenosis is present.  7. The inferior vena cava is normal in size with greater than 50% respiratory variability, suggesting right atrial pressure of 3 mmHg. FINDINGS  Left Ventricle: Left ventricular ejection fraction, by estimation, is 60 to 65%. The left ventricle has normal function. The left ventricle has no regional wall motion abnormalities. The left ventricular internal cavity size was normal in size. There is  mild left ventricular hypertrophy. Left ventricular diastolic parameters are indeterminate. Elevated left atrial pressure. Right Ventricle: The right ventricular size is not well visualized. Right vetricular wall thickness was not well visualized. Right ventricular systolic function was not well visualized. Left Atrium: Left atrial size was severely dilated. Right Atrium: Right atrial size was severely dilated. Pericardium: There is no evidence of pericardial effusion. Mitral Valve: The mitral valve is normal in structure. Trivial mitral valve regurgitation. No evidence of mitral valve stenosis. Tricuspid Valve: The tricuspid valve is normal in structure. Tricuspid valve regurgitation is mild . No evidence of tricuspid stenosis. Aortic Valve: The aortic valve has an indeterminant number of cusps. There is mild calcification of the aortic valve. There is mild thickening of the aortic valve. There is mild aortic valve annular calcification. Aortic valve regurgitation is not visualized. No aortic stenosis is present. Aortic valve mean gradient measures 2.5 mmHg. Aortic valve peak gradient measures 4.9 mmHg. Aortic valve area, by VTI measures 2.24 cm. Pulmonic Valve: The pulmonic valve was not well visualized. Pulmonic valve regurgitation is not visualized. No evidence of pulmonic stenosis. Aorta: The aortic root is normal in size and structure. Pulmonary Artery:  Indeterminant PASP, inadequate TR jet. Venous: The inferior vena cava is normal in size  with greater than 50% respiratory variability, suggesting right atrial pressure of 3 mmHg. IAS/Shunts: No atrial level shunt detected by color flow Doppler.  LEFT VENTRICLE PLAX 2D LVIDd:         4.43 cm  Diastology LVIDs:         2.90 cm  LV e' medial:    4.90 cm/s LV PW:         1.33 cm  LV E/e' medial:  36.9 LV IVS:        1.10 cm  LV e' lateral:   10.60 cm/s LVOT diam:     2.10 cm  LV E/e' lateral: 17.1 LV SV:         52 LV SV Index:   24 LVOT Area:     3.46 cm  RIGHT VENTRICLE RV S prime:     7.18 cm/s TAPSE (M-mode): 1.4 cm LEFT ATRIUM              Index       RIGHT ATRIUM           Index LA diam:        4.90 cm  2.32 cm/m  RA Area:     33.50 cm LA Vol (A2C):   124.0 ml 58.65 ml/m RA Volume:   117.00 ml 55.33 ml/m LA Vol (A4C):   92.9 ml  43.94 ml/m LA Biplane Vol: 107.0 ml 50.61 ml/m  AORTIC VALVE AV Area (Vmax):    2.75 cm AV Area (Vmean):   2.22 cm AV Area (VTI):     2.24 cm AV Vmax:           110.76 cm/s AV Vmean:          73.475 cm/s AV VTI:            0.230 m AV Peak Grad:      4.9 mmHg AV Mean Grad:      2.5 mmHg LVOT Vmax:         87.78 cm/s LVOT Vmean:        47.054 cm/s LVOT VTI:          0.149 m LVOT/AV VTI ratio: 0.65  AORTA Ao Root diam: 3.30 cm MITRAL VALVE                TRICUSPID VALVE MV Area (PHT): 4.41 cm     TR Peak grad:   25.4 mmHg MV Decel Time: 172 msec     TR Vmax:        252.00 cm/s MV E velocity: 181.00 cm/s MV A velocity: 46.00 cm/s   SHUNTS MV E/A ratio:  3.93         Systemic VTI:  0.15 m                             Systemic Diam: 2.10 cm Carlyle Dolly MD Electronically signed by Carlyle Dolly MD Signature Date/Time: 04/17/2020/11:07:03 AM    Final     Orson Eva, DO  [facility]  If 7PM-7AM, please contact night-coverage [email or web address] Password [email or web address] 04/18/2020, 3:27 PM   LOS: 1 day

## 2020-04-19 ENCOUNTER — Inpatient Hospital Stay (HOSPITAL_COMMUNITY): Payer: Medicare Other

## 2020-04-19 DIAGNOSIS — R2681 Unsteadiness on feet: Secondary | ICD-10-CM

## 2020-04-19 LAB — GLUCOSE, CAPILLARY
Glucose-Capillary: 166 mg/dL — ABNORMAL HIGH (ref 70–99)
Glucose-Capillary: 173 mg/dL — ABNORMAL HIGH (ref 70–99)

## 2020-04-19 LAB — BASIC METABOLIC PANEL
Anion gap: 9 (ref 5–15)
BUN: 17 mg/dL (ref 8–23)
CO2: 29 mmol/L (ref 22–32)
Calcium: 8.7 mg/dL — ABNORMAL LOW (ref 8.9–10.3)
Chloride: 95 mmol/L — ABNORMAL LOW (ref 98–111)
Creatinine, Ser: 0.87 mg/dL (ref 0.61–1.24)
GFR, Estimated: >60 mL/min (ref >60)
Glucose, Bld: 176 mg/dL — ABNORMAL HIGH (ref 70–99)
Potassium: 3.2 mmol/L — ABNORMAL LOW (ref 3.5–5.1)
Sodium: 133 mmol/L — ABNORMAL LOW (ref 135–145)

## 2020-04-19 LAB — MAGNESIUM: Magnesium: 1.4 mg/dL — ABNORMAL LOW (ref 1.7–2.4)

## 2020-04-19 MED ORDER — TRAMADOL HCL 50 MG PO TABS: 50.0000 mg | ORAL_TABLET | Freq: Four times a day (QID) | ORAL | 0 refills | Status: DC | PRN

## 2020-04-19 MED ORDER — METOPROLOL TARTRATE 50 MG PO TABS: 50.0000 mg | ORAL_TABLET | Freq: Two times a day (BID) | ORAL | 1 refills | Status: DC

## 2020-04-19 MED ORDER — POTASSIUM CHLORIDE CRYS ER 10 MEQ PO TBCR: 10.0000 meq | EXTENDED_RELEASE_TABLET | Freq: Every day | ORAL | Status: DC

## 2020-04-19 MED ORDER — ASPIRIN 81 MG PO TBEC: 81.0000 mg | DELAYED_RELEASE_TABLET | Freq: Every day | ORAL | 11 refills | Status: AC

## 2020-04-19 MED ORDER — ASPIRIN EC 81 MG PO TBEC
81.0000 mg | DELAYED_RELEASE_TABLET | Freq: Every day | ORAL | Status: DC
  Administered 2020-04-19: 81 mg via ORAL
  Filled 2020-04-19: qty 1

## 2020-04-19 MED ORDER — FUROSEMIDE 20 MG PO TABS: 20.0000 mg | ORAL_TABLET | Freq: Every day | ORAL | 1 refills | Status: DC

## 2020-04-19 MED ORDER — POTASSIUM CHLORIDE CRYS ER 20 MEQ PO TBCR
40.0000 meq | EXTENDED_RELEASE_TABLET | Freq: Once | ORAL | Status: AC
  Administered 2020-04-19: 40 meq via ORAL
  Filled 2020-04-19: qty 2

## 2020-04-19 MED ORDER — POTASSIUM CHLORIDE CRYS ER 10 MEQ PO TBCR: 10.0000 meq | EXTENDED_RELEASE_TABLET | Freq: Every day | ORAL | 0 refills | Status: DC

## 2020-04-19 MED ORDER — MAGNESIUM SULFATE 2 GM/50ML IV SOLN
2.0000 g | Freq: Once | INTRAVENOUS | Status: AC
  Administered 2020-04-19: 2 g via INTRAVENOUS
  Filled 2020-04-19: qty 50

## 2020-04-19 MED ORDER — FUROSEMIDE 20 MG PO TABS: 20.0000 mg | ORAL_TABLET | Freq: Every day | ORAL | Status: DC

## 2020-04-19 NOTE — Progress Notes (Signed)
Physical Therapy Treatment Patient Details Name: Kevin Daniel MRN: [id number] DOB: 07-08-1945 Today's Date: 04/19/2020    History of Present Illness Kevin Daniel is a 74 y.o. male with medical history significant for atrial fibrillation, hypertension, OSA, diabetes mellitus.  Patient presented to the ED with complaints of generalized weakness of 2 weeks, with bilateral lower extremity swelling.  Patient reports that he cannot ambulate at home.  In the past month, his second fall was 12/9, EMS was called, the patient did not want to come to the ED.  Patient reports bilateral lower extremity swelling over the past few weeks.  He denies abdominal bloating, he denies difficulty breathing, no chest pain.  He has a chronic unchanged cough.    He tells me his normal weight is about 225 pounds, and he has actually lost weight, now weighing 205 pounds.  Patient lives with his spouse in a motor home.  He previously used to walk without assistance, but with onset of weakness, he has barely been able to ambulate at all over the past month.  Patient would like some rehab therapy.    PT Comments    Patient present in room in bed and agreed to complete PT. Completed Therapeutic Exercises, detailed below, before ambulation with good form and little verbal cues required. During ambulation, able to complete with decreased but safe speed and increased shuffling in gait. Completed sit to stand transfer from bed and stand pivot transfer to chair with Min guard and improved independence for safety at home. Nurse notified left in chair with alarm nearby. Patient will benefit from continued physical therapy in hospital and recommended venue below to increase strength, balance, endurance for safe ADLs and gait.   Follow Up Recommendations  SNF     Equipment Recommendations  None recommended by PT    Recommendations for Other Services       Precautions / Restrictions Precautions Precautions:  Fall Restrictions Weight Bearing Restrictions: No    Mobility  Bed Mobility Overal bed mobility: Needs Assistance Bed Mobility: Supine to Sit     Supine to sit: Min assist     General bed mobility comments: slow labored movement, from bed with HOB elevated  Transfers Overall transfer level: Needs assistance Equipment used: Rolling walker (2 wheeled) Transfers: Sit to/from [facility] Sit to Stand: Min guard;Min assist Stand pivot transfers: Min assist;Min guard       General transfer comment: increased time, labored movement  Ambulation/Gait Ambulation/Gait assistance: Min guard Gait Distance (Feet): 50 Feet Assistive device: Rolling walker (2 wheeled) Gait Pattern/deviations: Decreased step length - right;Decreased step length - left;Decreased stride length;Step-through pattern Gait velocity: decreased   General Gait Details: slow labored cadence without loss of balance, limited mostly due to fatigue   Stairs             Wheelchair Mobility    Modified Rankin (Stroke Patients Only)       Balance Overall balance assessment: Needs assistance Sitting-balance support: Feet supported;No upper extremity supported Sitting balance-Leahy Scale: Good Sitting balance - Comments: seated at EOB   Standing balance support: During functional activity;Bilateral upper extremity supported Standing balance-Leahy Scale: Fair Standing balance comment: using RW                            Cognition Arousal/Alertness: Awake/alert Behavior During Therapy: WFL for tasks assessed/performed Overall Cognitive Status: Within Functional Limits for tasks assessed  Exercises General Exercises - Lower Extremity Ankle Circles/Pumps: AROM;Strengthening;Both;10 reps;Supine Long Arc Quad: AROM;Strengthening;Both;10 reps;Seated Hip Flexion/Marching: AROM;Strengthening;Both;10 reps;Seated    General  Comments        Pertinent Vitals/Pain Pain Assessment: No/denies pain    Home Living                      Prior Function            PT Goals (current goals can now be found in the care plan section) Acute Rehab PT Goals Patient Stated Goal: return home after rehab PT Goal Formulation: With patient/family Time For Goal Achievement: 05/01/20 Potential to Achieve Goals: Good Progress towards PT goals: Progressing toward goals    Frequency    Min 3X/week      PT Plan Current plan remains appropriate    Co-evaluation              AM-PAC PT "6 Clicks" Mobility   Outcome Measure  Help needed turning from your back to your side while in a flat bed without using bedrails?: A Little Help needed moving from lying on your back to sitting on the side of a flat bed without using bedrails?: A Little Help needed moving to and from a bed to a chair (including a wheelchair)?: A Little Help needed standing up from a chair using your arms (e.g., wheelchair or bedside chair)?: A Little Help needed to walk in hospital room?: A Little Help needed climbing 3-5 steps with a railing? : A Lot 6 Click Score: 17    End of Session   Activity Tolerance: Patient tolerated treatment well;Patient limited by fatigue Patient left: in chair;with call bell/phone within reach Nurse Communication: Mobility status PT Visit Diagnosis: Unsteadiness on feet (R26.81);Other abnormalities of gait and mobility (R26.89);Muscle weakness (generalized) (M62.81)     Time: 3545-6256 PT Time Calculation (min) (ACUTE ONLY): 10 min  Charges:  $Therapeutic Exercise: 8-22 mins                     11:10 AM,04/19/20 Domenic Moras, PT, DPT Physical Therapist at [facility]

## 2020-04-19 NOTE — Progress Notes (Signed)
Report called to [place] at [facility]. No further questions at this time.

## 2020-04-19 NOTE — Progress Notes (Signed)
Pt has d/c orders to go to [facility] facility. Report attempted x1. EMS has been called via Education officer, museum, awaiting arrival. Pt is aware of discharge.

## 2020-04-19 NOTE — TOC Transition Note (Signed)
Transition of Care [facility]) - CM/SW Discharge Note   Patient Details  Name: Buck Mcaffee MRN: [id number] Date of Birth: August 21, 1945  [facility]) CM/SW Contact:  [name], LCSW Phone Number: 04/19/2020, 10:57 AM   Clinical Narrative:     Pt medically stable for dc today per MD. Updated Janee Morn at [facility] and they can accept pt today. DC clinical will be sent electronically. RN to call report. Will arrange EMS when pt ready.  There are no other TOC needs for dc.  Final next level of care: Skilled Nursing Facility Barriers to Discharge: Barriers Resolved   Patient Goals and CMS Choice Patient states their goals for this hospitalization and ongoing recovery are:: get better CMS Medicare.gov Compare Post Acute Care list provided to:: Patient Represenative (must comment) Choice offered to / list presented to : Spouse,Patient  Discharge Placement PASRR number recieved: 04/17/20            Patient chooses bed at: [facility] Patient to be transferred to facility by: EMS Name of family member notified: [name] Patient and family notified of of transfer: 04/19/20  Discharge Plan and Services In-house Referral: Clinical Social Work   Post Acute Care Choice: [place]                               Social Determinants of Health (SDOH) Interventions     Readmission Risk Interventions No flowsheet data found.

## 2020-04-19 NOTE — Discharge Summary (Signed)
Physician Discharge Summary  Kevin Daniel ASN:[id number] DOB: 01/12/1946 DOA: 04/16/2020  PCP: Brunetta Jeans, PA-C  Admit date: 04/16/2020 Discharge date: 04/19/2020  Admitted From: Home Disposition:  SNF  Recommendations for Outpatient Follow-up:  1. Follow up with PCP in 1-2 weeks 2. Please obtain BMP/CBC in one week     Discharge Condition: Stable CODE STATUS: FULL Diet recommendation: Heart Healthy / Carb Modified   Brief/Interim Summary: 74 y.o.malewith medical history significant foratrial fibrillation, hypertension,OSA,diabetes mellitus type 2. Patient presented to the ED with complaints of generalized weakness of 2 weeks, with bilateral lower extremity swelling over the past 3 to 4 months.. Patient reports increasing generalized weakness and falling for the last 3 to 4 months. He has had to sleep in a recliner for that period of time. He feels that he is actually lost some weight, but he is trying to lose weight. He states that he is down to a 5 pounds. He denies any fever, chills, chest pain, nausea, vomiting, diarrhea, abdominal pain. In the emergency department, the patient was afebrile hemodynamically stable with oxygen saturation 94% on room air. Chest x-ray showed increased vascular congestion. BNP was 149.0. The patient was given furosemide 40 mg IV x1.  He was subsequently started on po lasix with continued clinical improvement.   Discharge Diagnoses:   Acute diastolic CHF -Significant improvement with one dose of IV furosemide -Continue furosemide p.o. 20 mg daily after d/c -Accurate I's and O's--incomplete documentation -04/17/2020 echo--EF 60-65%, no WMA, trivial MR  Lower extremity edema -Venous duplex--neg -Urine protein creatinine ratio--0.07  Hypomagnesemia -Repleted  Atrial fibrillation, type unspecified -CHADS-VASc = 6 (CHF, HTN, Age, DM2, Stroke) -not on AC due to frequent falls -start ASA 81 mg -Continue metoprolol for  rate control  Diabetes mellitus type 2 -04/18/2020 hemoglobin A1c 6.7 -NovoLog sliding scale -continue monitoring CBGs q AC after d/c  Essential hypertension -discontinue chlorthalidone and losartan -Continue metoprolol  Generalized weakness -PT eval-->SNF -Serum L93--790 -Folic acid 8.5 -UA no pyuria -TSH 2.370  Stage 2 sacral ulcer -present on admission -continue local care   Discharge Instructions   Allergies as of 04/19/2020      Reactions   Lisinopril Swelling, Other (See Comments)   angioedema      Medication List    STOP taking these medications   chlorthalidone 25 MG tablet Commonly known as: HYGROTON   losartan 50 MG tablet Commonly known as: COZAAR   mesalamine 1.2 g EC tablet Commonly known as: LIALDA   predniSONE 10 MG tablet Commonly known as: DELTASONE     TAKE these medications   allopurinol 300 MG tablet Commonly known as: ZYLOPRIM TAKE 1 TABLET BY MOUTH EVERY DAY   aspirin 81 MG EC tablet Take 1 tablet (81 mg total) by mouth daily. Swallow whole.   escitalopram 20 MG tablet Commonly known as: LEXAPRO Take 1 tablet (20 mg total) by mouth daily.   FreeStyle Libre 14 Day Reader Kerrin Mo Apply reader for monitoring of continuous glucose monitoring   FreeStyle Libre 14 Day Sensor Misc Apply sensor every 14 days for continuous blood glucose monitoring   furosemide 20 MG tablet Commonly known as: LASIX Take 1 tablet (20 mg total) by mouth daily. Start taking on: April 20, 2020   insulin NPH Human 100 UNIT/ML injection Commonly known as: NOVOLIN N Inject into the skin daily as needed (for sugar).   insulin regular 100 units/mL injection Commonly known as: NOVOLIN R Inject into the skin 3 (three) times daily as  needed for high blood sugar.   metoprolol tartrate 50 MG tablet Commonly known as: LOPRESSOR Take 1 tablet (50 mg total) by mouth 2 (two) times daily. What changed: See the new instructions.   potassium chloride 10  MEQ tablet Commonly known as: KLOR-CON Take 1 tablet (10 mEq total) by mouth daily. Start taking on: April 20, 2020   traMADol 50 MG tablet Commonly known as: ULTRAM Take 1 tablet (50 mg total) by mouth every 6 (six) hours as needed for moderate pain.   traZODone 100 MG tablet Commonly known as: DESYREL Take 1 tablet (100 mg total) by mouth at bedtime.       Contact information for after-discharge care    Destination    [facility] .   Service: Skilled Nursing Contact information: [address] [place] [phone]                 Allergies  Allergen Reactions  . Lisinopril Swelling and Other (See Comments)    angioedema    Consultations:  none   Procedures/Studies: CT Head Wo Contrast  Result Date: 04/16/2020 CLINICAL DATA:  Minor head trauma EXAM: CT HEAD WITHOUT CONTRAST TECHNIQUE: Contiguous axial images were obtained from the base of the skull through the vertex without intravenous contrast. COMPARISON:  None. FINDINGS: Brain: Generalized atrophy most prominent the frontal lobes. Negative for hydrocephalus. Mild white matter changes. Chronic infarct right cerebellum. Negative for acute infarct, hemorrhage, mass. Vascular: Negative for hyperdense vessel Skull: Negative Sinuses/Orbits: Mild mucosal edema right maxillary sinus otherwise clear sinuses. Negative orbit. Other: None IMPRESSION: No acute abnormality. Generalized atrophy with mild chronic microvascular ischemic change in the white matter. Chronic infarct right inferior cerebellum. Electronically Signed   By: Franchot Gallo M.D.   On: 04/16/2020 14:29   US Venous Img Lower Bilateral (DVT)  Result Date: 04/19/2020 CLINICAL DATA:  Bilateral lower extremity edema for several months EXAM: BILATERAL LOWER EXTREMITY VENOUS DOPPLER ULTRASOUND TECHNIQUE: Gray-scale sonography with graded compression, as well as color Doppler and  duplex ultrasound were performed to evaluate the lower extremity deep venous systems from the level of the common femoral vein and including the common femoral, femoral, profunda femoral, popliteal and calf veins including the posterior tibial, peroneal and gastrocnemius veins when visible. The superficial great saphenous vein was also interrogated. Spectral Doppler was utilized to evaluate flow at rest and with distal augmentation maneuvers in the common femoral, femoral and popliteal veins. COMPARISON:  None. FINDINGS: RIGHT LOWER EXTREMITY Common Femoral Vein: No evidence of thrombus. Normal compressibility, respiratory phasicity and response to augmentation. Saphenofemoral Junction: No evidence of thrombus. Normal compressibility and flow on color Doppler imaging. Profunda Femoral Vein: No evidence of thrombus. Normal compressibility and flow on color Doppler imaging. Femoral Vein: No evidence of thrombus. Normal compressibility, respiratory phasicity and response to augmentation. Popliteal Vein: No evidence of thrombus. Normal compressibility, respiratory phasicity and response to augmentation. Calf Veins: No evidence of thrombus. Normal compressibility and flow on color Doppler imaging. Superficial Great Saphenous Vein: No evidence of thrombus. Normal compressibility. Venous Reflux:  None. Other Findings:  None. LEFT LOWER EXTREMITY Common Femoral Vein: No evidence of thrombus. Normal compressibility, respiratory phasicity and response to augmentation. Saphenofemoral Junction: No evidence of thrombus. Normal compressibility and flow on color Doppler imaging. Profunda Femoral Vein: No evidence of thrombus. Normal compressibility and flow on color Doppler imaging. Femoral Vein: No evidence of thrombus. Normal compressibility, respiratory phasicity and response to augmentation.  Popliteal Vein: No evidence of thrombus. Normal compressibility, respiratory phasicity and response to augmentation. Calf Veins: No  evidence of thrombus. Normal compressibility and flow on color Doppler imaging. Superficial Great Saphenous Vein: No evidence of thrombus. Normal compressibility. Venous Reflux:  None. Other Findings:  None. IMPRESSION: No evidence of deep venous thrombosis in either lower extremity. Electronically Signed   By: Inez Catalina M.D.   On: 04/19/2020 10:37   DG Chest Portable 1 View  Result Date: 04/16/2020 CLINICAL DATA:  History of generalized weakness in a 74 year old male EXAM: PORTABLE CHEST 1 VIEW COMPARISON:  None FINDINGS: Median sternotomy. Heart size is enlarged and accentuated by portable technique. Minimal opacities at the LEFT lung base. Vascular congestion without frank edema. No gross effusion. On limited assessment no acute skeletal process. IMPRESSION: 1. Minimal opacities at the LEFT lung base may represent atelectasis versus developing infection. 2. Vascular congestion without frank edema. 3. Post median sternotomy with cardiac enlargement. Electronically Signed   By: Zetta Bills M.D.   On: 04/16/2020 12:59   ECHOCARDIOGRAM COMPLETE  Result Date: 04/17/2020    ECHOCARDIOGRAM REPORT   Patient Name:   Kevin Daniel Date of Exam: 04/17/2020 Medical Rec #:  [id number]            Height:       69.0 in Accession #:    [id number]           Weight:       211.2 lb Date of Birth:  02/19/1946           BSA:          2.114 m Patient Age:    40 years             BP:           114/68 mmHg Patient Gender: M                    HR:           68 bpm. Exam Location:  [facility] Procedure: 2D Echo Indications:    Leg swelling [[id number]  History:        Patient has no prior history of Echocardiogram examinations.                 Stroke, Arrythmias:Atrial Fibrillation; Risk Factors:Non-Smoker,                 Diabetes, Dyslipidemia and Hypertension. Post-CPB: s/p MAZE and                 CABG 2010. Per patient LAA removed.  Sonographer:    Leavy Cella RDCS (AE) Referring Phys: [place]  1. Left ventricular ejection fraction, by estimation, is 60 to 65%. The left ventricle has normal function. The left ventricle has no regional wall motion abnormalities. There is mild left ventricular hypertrophy. Left ventricular diastolic parameters are indeterminate. Elevated left atrial pressure.  2. Right ventricular systolic function was not well visualized. The right ventricular size is not well visualized.  3. Left atrial size was severely dilated.  4. Right atrial size was severely dilated.  5. The mitral valve is normal in structure. Trivial mitral valve regurgitation. No evidence of mitral stenosis.  6. The aortic valve has an indeterminant number of cusps. There is mild calcification of the aortic valve. There is mild thickening of the aortic valve. Aortic valve regurgitation is not visualized. No aortic stenosis is present.  7. The  inferior vena cava is normal in size with greater than 50% respiratory variability, suggesting right atrial pressure of 3 mmHg. FINDINGS  Left Ventricle: Left ventricular ejection fraction, by estimation, is 60 to 65%. The left ventricle has normal function. The left ventricle has no regional wall motion abnormalities. The left ventricular internal cavity size was normal in size. There is  mild left ventricular hypertrophy. Left ventricular diastolic parameters are indeterminate. Elevated left atrial pressure. Right Ventricle: The right ventricular size is not well visualized. Right vetricular wall thickness was not well visualized. Right ventricular systolic function was not well visualized. Left Atrium: Left atrial size was severely dilated. Right Atrium: Right atrial size was severely dilated. Pericardium: There is no evidence of pericardial effusion. Mitral Valve: The mitral valve is normal in structure. Trivial mitral valve regurgitation. No evidence of mitral valve stenosis. Tricuspid Valve: The tricuspid valve is normal in structure. Tricuspid valve  regurgitation is mild . No evidence of tricuspid stenosis. Aortic Valve: The aortic valve has an indeterminant number of cusps. There is mild calcification of the aortic valve. There is mild thickening of the aortic valve. There is mild aortic valve annular calcification. Aortic valve regurgitation is not visualized. No aortic stenosis is present. Aortic valve mean gradient measures 2.5 mmHg. Aortic valve peak gradient measures 4.9 mmHg. Aortic valve area, by VTI measures 2.24 cm. Pulmonic Valve: The pulmonic valve was not well visualized. Pulmonic valve regurgitation is not visualized. No evidence of pulmonic stenosis. Aorta: The aortic root is normal in size and structure. Pulmonary Artery: Indeterminant PASP, inadequate TR jet. Venous: The inferior vena cava is normal in size with greater than 50% respiratory variability, suggesting right atrial pressure of 3 mmHg. IAS/Shunts: No atrial level shunt detected by color flow Doppler.  LEFT VENTRICLE PLAX 2D LVIDd:         4.43 cm  Diastology LVIDs:         2.90 cm  LV e' medial:    4.90 cm/s LV PW:         1.33 cm  LV E/e' medial:  36.9 LV IVS:        1.10 cm  LV e' lateral:   10.60 cm/s LVOT diam:     2.10 cm  LV E/e' lateral: 17.1 LV SV:         52 LV SV Index:   24 LVOT Area:     3.46 cm  RIGHT VENTRICLE RV S prime:     7.18 cm/s TAPSE (M-mode): 1.4 cm LEFT ATRIUM              Index       RIGHT ATRIUM           Index LA diam:        4.90 cm  2.32 cm/m  RA Area:     33.50 cm LA Vol (A2C):   124.0 ml 58.65 ml/m RA Volume:   117.00 ml 55.33 ml/m LA Vol (A4C):   92.9 ml  43.94 ml/m LA Biplane Vol: 107.0 ml 50.61 ml/m  AORTIC VALVE AV Area (Vmax):    2.75 cm AV Area (Vmean):   2.22 cm AV Area (VTI):     2.24 cm AV Vmax:           110.76 cm/s AV Vmean:          73.475 cm/s AV VTI:            0.230 m AV Peak Grad:  4.9 mmHg AV Mean Grad:      2.5 mmHg LVOT Vmax:         87.78 cm/s LVOT Vmean:        47.054 cm/s LVOT VTI:          0.149 m LVOT/AV VTI ratio:  0.65  AORTA Ao Root diam: 3.30 cm MITRAL VALVE                TRICUSPID VALVE MV Area (PHT): 4.41 cm     TR Peak grad:   25.4 mmHg MV Decel Time: 172 msec     TR Vmax:        252.00 cm/s MV E velocity: 181.00 cm/s MV A velocity: 46.00 cm/s   SHUNTS MV E/A ratio:  3.93         Systemic VTI:  0.15 m                             Systemic Diam: 2.10 cm Carlyle Dolly MD Electronically signed by Carlyle Dolly MD Signature Date/Time: 04/17/2020/11:07:03 AM    Final         Discharge Exam: Vitals:   04/19/20 0344 04/19/20 0920  BP: 121/77 124/78  Pulse: (!) 53 70  Resp: 18   Temp: 97.7 F (36.5 C)   SpO2: 97%    Vitals:   04/18/20 1529 04/18/20 2105 04/19/20 0344 04/19/20 0920  BP: 112/74 110/64 121/77 124/78  Pulse: 90 95 (!) 53 70  Resp: 18 18 18    Temp: 98.3 F (36.8 C) 97.9 F (36.6 C) 97.7 F (36.5 C)   TempSrc:      SpO2: 100% 95% 97%   Weight:      Height:        General: Pt is alert, awake, not in acute distress Cardiovascular: RRR, S1/S2 +, no rubs, no gallops Respiratory: fine bibasilar crackles. No wheeze Abdominal: Soft, NT, ND, bowel sounds + Extremities: trace LE edema, no cyanosis   The results of significant diagnostics from this hospitalization (including imaging, microbiology, ancillary and laboratory) are listed below for reference.    Significant Diagnostic Studies: CT Head Wo Contrast  Result Date: 04/16/2020 CLINICAL DATA:  Minor head trauma EXAM: CT HEAD WITHOUT CONTRAST TECHNIQUE: Contiguous axial images were obtained from the base of the skull through the vertex without intravenous contrast. COMPARISON:  None. FINDINGS: Brain: Generalized atrophy most prominent the frontal lobes. Negative for hydrocephalus. Mild white matter changes. Chronic infarct right cerebellum. Negative for acute infarct, hemorrhage, mass. Vascular: Negative for hyperdense vessel Skull: Negative Sinuses/Orbits: Mild mucosal edema right maxillary sinus otherwise clear sinuses.  Negative orbit. Other: None IMPRESSION: No acute abnormality. Generalized atrophy with mild chronic microvascular ischemic change in the white matter. Chronic infarct right inferior cerebellum. Electronically Signed   By: Franchot Gallo M.D.   On: 04/16/2020 14:29   US Venous Img Lower Bilateral (DVT)  Result Date: 04/19/2020 CLINICAL DATA:  Bilateral lower extremity edema for several months EXAM: BILATERAL LOWER EXTREMITY VENOUS DOPPLER ULTRASOUND TECHNIQUE: Gray-scale sonography with graded compression, as well as color Doppler and duplex ultrasound were performed to evaluate the lower extremity deep venous systems from the level of the common femoral vein and including the common femoral, femoral, profunda femoral, popliteal and calf veins including the posterior tibial, peroneal and gastrocnemius veins when visible. The superficial great saphenous vein was also interrogated. Spectral Doppler was utilized to evaluate flow at rest and with distal augmentation  maneuvers in the common femoral, femoral and popliteal veins. COMPARISON:  None. FINDINGS: RIGHT LOWER EXTREMITY Common Femoral Vein: No evidence of thrombus. Normal compressibility, respiratory phasicity and response to augmentation. Saphenofemoral Junction: No evidence of thrombus. Normal compressibility and flow on color Doppler imaging. Profunda Femoral Vein: No evidence of thrombus. Normal compressibility and flow on color Doppler imaging. Femoral Vein: No evidence of thrombus. Normal compressibility, respiratory phasicity and response to augmentation. Popliteal Vein: No evidence of thrombus. Normal compressibility, respiratory phasicity and response to augmentation. Calf Veins: No evidence of thrombus. Normal compressibility and flow on color Doppler imaging. Superficial Great Saphenous Vein: No evidence of thrombus. Normal compressibility. Venous Reflux:  None. Other Findings:  None. LEFT LOWER EXTREMITY Common Femoral Vein: No evidence of  thrombus. Normal compressibility, respiratory phasicity and response to augmentation. Saphenofemoral Junction: No evidence of thrombus. Normal compressibility and flow on color Doppler imaging. Profunda Femoral Vein: No evidence of thrombus. Normal compressibility and flow on color Doppler imaging. Femoral Vein: No evidence of thrombus. Normal compressibility, respiratory phasicity and response to augmentation. Popliteal Vein: No evidence of thrombus. Normal compressibility, respiratory phasicity and response to augmentation. Calf Veins: No evidence of thrombus. Normal compressibility and flow on color Doppler imaging. Superficial Great Saphenous Vein: No evidence of thrombus. Normal compressibility. Venous Reflux:  None. Other Findings:  None. IMPRESSION: No evidence of deep venous thrombosis in either lower extremity. Electronically Signed   By: Inez Catalina M.D.   On: 04/19/2020 10:37   DG Chest Portable 1 View  Result Date: 04/16/2020 CLINICAL DATA:  History of generalized weakness in a 74 year old male EXAM: PORTABLE CHEST 1 VIEW COMPARISON:  None FINDINGS: Median sternotomy. Heart size is enlarged and accentuated by portable technique. Minimal opacities at the LEFT lung base. Vascular congestion without frank edema. No gross effusion. On limited assessment no acute skeletal process. IMPRESSION: 1. Minimal opacities at the LEFT lung base may represent atelectasis versus developing infection. 2. Vascular congestion without frank edema. 3. Post median sternotomy with cardiac enlargement. Electronically Signed   By: Zetta Bills M.D.   On: 04/16/2020 12:59   ECHOCARDIOGRAM COMPLETE  Result Date: 04/17/2020    ECHOCARDIOGRAM REPORT   Patient Name:   Kevin Daniel Date of Exam: 04/17/2020 Medical Rec #:  [id number]            Height:       69.0 in Accession #:    [id number]           Weight:       211.2 lb Date of Birth:  Feb 17, 1946           BSA:          2.114 m Patient Age:    74 years              BP:           114/68 mmHg Patient Gender: M                    HR:           68 bpm. Exam Location:  [facility] Procedure: 2D Echo Indications:    Leg swelling [[id number]  History:        Patient has no prior history of Echocardiogram examinations.                 Stroke, Arrythmias:Atrial Fibrillation; Risk Factors:Non-Smoker,                 Diabetes,  Dyslipidemia and Hypertension. Post-CPB: s/p MAZE and                 CABG 2010. Per patient LAA removed.  Sonographer:    Leavy Cella RDCS (AE) Referring Phys: [place]  1. Left ventricular ejection fraction, by estimation, is 60 to 65%. The left ventricle has normal function. The left ventricle has no regional wall motion abnormalities. There is mild left ventricular hypertrophy. Left ventricular diastolic parameters are indeterminate. Elevated left atrial pressure.  2. Right ventricular systolic function was not well visualized. The right ventricular size is not well visualized.  3. Left atrial size was severely dilated.  4. Right atrial size was severely dilated.  5. The mitral valve is normal in structure. Trivial mitral valve regurgitation. No evidence of mitral stenosis.  6. The aortic valve has an indeterminant number of cusps. There is mild calcification of the aortic valve. There is mild thickening of the aortic valve. Aortic valve regurgitation is not visualized. No aortic stenosis is present.  7. The inferior vena cava is normal in size with greater than 50% respiratory variability, suggesting right atrial pressure of 3 mmHg. FINDINGS  Left Ventricle: Left ventricular ejection fraction, by estimation, is 60 to 65%. The left ventricle has normal function. The left ventricle has no regional wall motion abnormalities. The left ventricular internal cavity size was normal in size. There is  mild left ventricular hypertrophy. Left ventricular diastolic parameters are indeterminate. Elevated left atrial pressure. Right Ventricle:  The right ventricular size is not well visualized. Right vetricular wall thickness was not well visualized. Right ventricular systolic function was not well visualized. Left Atrium: Left atrial size was severely dilated. Right Atrium: Right atrial size was severely dilated. Pericardium: There is no evidence of pericardial effusion. Mitral Valve: The mitral valve is normal in structure. Trivial mitral valve regurgitation. No evidence of mitral valve stenosis. Tricuspid Valve: The tricuspid valve is normal in structure. Tricuspid valve regurgitation is mild . No evidence of tricuspid stenosis. Aortic Valve: The aortic valve has an indeterminant number of cusps. There is mild calcification of the aortic valve. There is mild thickening of the aortic valve. There is mild aortic valve annular calcification. Aortic valve regurgitation is not visualized. No aortic stenosis is present. Aortic valve mean gradient measures 2.5 mmHg. Aortic valve peak gradient measures 4.9 mmHg. Aortic valve area, by VTI measures 2.24 cm. Pulmonic Valve: The pulmonic valve was not well visualized. Pulmonic valve regurgitation is not visualized. No evidence of pulmonic stenosis. Aorta: The aortic root is normal in size and structure. Pulmonary Artery: Indeterminant PASP, inadequate TR jet. Venous: The inferior vena cava is normal in size with greater than 50% respiratory variability, suggesting right atrial pressure of 3 mmHg. IAS/Shunts: No atrial level shunt detected by color flow Doppler.  LEFT VENTRICLE PLAX 2D LVIDd:         4.43 cm  Diastology LVIDs:         2.90 cm  LV e' medial:    4.90 cm/s LV PW:         1.33 cm  LV E/e' medial:  36.9 LV IVS:        1.10 cm  LV e' lateral:   10.60 cm/s LVOT diam:     2.10 cm  LV E/e' lateral: 17.1 LV SV:         52 LV SV Index:   24 LVOT Area:     3.46 cm  RIGHT VENTRICLE RV  S prime:     7.18 cm/s TAPSE (M-mode): 1.4 cm LEFT ATRIUM              Index       RIGHT ATRIUM           Index LA diam:         4.90 cm  2.32 cm/m  RA Area:     33.50 cm LA Vol (A2C):   124.0 ml 58.65 ml/m RA Volume:   117.00 ml 55.33 ml/m LA Vol (A4C):   92.9 ml  43.94 ml/m LA Biplane Vol: 107.0 ml 50.61 ml/m  AORTIC VALVE AV Area (Vmax):    2.75 cm AV Area (Vmean):   2.22 cm AV Area (VTI):     2.24 cm AV Vmax:           110.76 cm/s AV Vmean:          73.475 cm/s AV VTI:            0.230 m AV Peak Grad:      4.9 mmHg AV Mean Grad:      2.5 mmHg LVOT Vmax:         87.78 cm/s LVOT Vmean:        47.054 cm/s LVOT VTI:          0.149 m LVOT/AV VTI ratio: 0.65  AORTA Ao Root diam: 3.30 cm MITRAL VALVE                TRICUSPID VALVE MV Area (PHT): 4.41 cm     TR Peak grad:   25.4 mmHg MV Decel Time: 172 msec     TR Vmax:        252.00 cm/s MV E velocity: 181.00 cm/s MV A velocity: 46.00 cm/s   SHUNTS MV E/A ratio:  3.93         Systemic VTI:  0.15 m                             Systemic Diam: 2.10 cm Carlyle Dolly MD Electronically signed by Carlyle Dolly MD Signature Date/Time: 04/17/2020/11:07:03 AM    Final      Microbiology: Recent Results (from the past 240 hour(s))  Resp Panel by RT-PCR (Flu A&B, Covid) Nasopharyngeal Swab     Status: None   Collection Time: 04/16/20  1:40 PM   Specimen: Nasopharyngeal Swab; Nasopharyngeal(NP) swabs in vial transport medium  Result Value Ref Range Status   SARS Coronavirus 2 by RT PCR NEGATIVE NEGATIVE Final    Comment: (NOTE) SARS-CoV-2 target nucleic acids are NOT DETECTED.  The SARS-CoV-2 RNA is generally detectable in upper respiratory specimens during the acute phase of infection. The lowest concentration of SARS-CoV-2 viral copies this assay can detect is 138 copies/mL. A negative result does not preclude SARS-Cov-2 infection and should not be used as the sole basis for treatment or other patient management decisions. A negative result may occur with  improper specimen collection/handling, submission of specimen other than nasopharyngeal swab, presence of viral  mutation(s) within the areas targeted by this assay, and inadequate number of viral copies(<138 copies/mL). A negative result must be combined with clinical observations, patient history, and epidemiological information. The expected result is Negative.  Fact Sheet for Patients:  EntrepreneurPulse.com.au  Fact Sheet for Healthcare Providers:  IncredibleEmployment.be  This test is no t yet approved or cleared by the [place] FDA and  has been authorized for detection  and/or diagnosis of SARS-CoV-2 by FDA under an Emergency Use Authorization (EUA). This EUA will remain  in effect (meaning this test can be used) for the duration of the COVID-19 declaration under Section 564(b)(1) of the Act, 21 U.S.C.section 360bbb-3(b)(1), unless the authorization is terminated  or revoked sooner.       Influenza A by PCR NEGATIVE NEGATIVE Final   Influenza B by PCR NEGATIVE NEGATIVE Final    Comment: (NOTE) The Xpert Xpress SARS-CoV-2/FLU/RSV plus assay is intended as an aid in the diagnosis of influenza from Nasopharyngeal swab specimens and should not be used as a sole basis for treatment. Nasal washings and aspirates are unacceptable for Xpert Xpress SARS-CoV-2/FLU/RSV testing.  Fact Sheet for Patients: EntrepreneurPulse.com.au  Fact Sheet for Healthcare Providers: IncredibleEmployment.be  This test is not yet approved or cleared by the [place] FDA and has been authorized for detection and/or diagnosis of SARS-CoV-2 by FDA under an Emergency Use Authorization (EUA). This EUA will remain in effect (meaning this test can be used) for the duration of the COVID-19 declaration under Section 564(b)(1) of the Act, 21 U.S.C. section 360bbb-3(b)(1), unless the authorization is terminated or revoked.  Performed at [facility], [address] [place], [place] [address]      Labs: Basic Metabolic Panel: Recent  Labs  Lab 04/16/20 1244 04/17/20 2139 04/18/20 0521 04/19/20 0601  NA 133* 135 137 133*  K 4.0 3.4* 4.3 3.2*  CL 96* 97* 97* 95*  CO2 27 28 30 29   GLUCOSE 185* 173* 153* 176*  BUN 18 17 15 17   CREATININE 0.83 0.89 0.84 0.87  CALCIUM 8.9 8.6* 8.8* 8.7*  MG 1.4*  --   --  1.4*   Liver Function Tests: Recent Labs  Lab 04/16/20 1244 04/18/20 0521  AST 19 18  ALT 17 14  ALKPHOS 88 74  BILITOT 0.7 0.9  PROT 7.0 6.6  ALBUMIN 2.8* 2.7*   Recent Labs  Lab 04/16/20 1244  LIPASE 22   Recent Labs  Lab 04/16/20 1244  AMMONIA 12   CBC: Recent Labs  Lab 04/16/20 1244 04/18/20 0521  WBC 9.2 7.1  NEUTROABS 7.2  --   HGB 13.0 12.1*  HCT 39.6 38.2*  MCV 105.0* 107.3*  PLT 204 200   Cardiac Enzymes: No results for input(s): CKTOTAL, CKMB, CKMBINDEX, TROPONINI in the last 168 hours. BNP: Invalid input(s): POCBNP CBG: Recent Labs  Lab 04/18/20 0817 04/18/20 1133 04/18/20 1646 04/18/20 2138 04/19/20 0809  GLUCAP 154* 156* 184* 134* 166*    Time coordinating discharge:  36 minutes  Signed:  Orson Eva, DO [facility] Pager: [phone] 04/19/2020, 10:53 AM

## 2020-04-24 ENCOUNTER — Other Ambulatory Visit: Payer: Self-pay | Admitting: Gastroenterology

## 2020-05-01 ENCOUNTER — Other Ambulatory Visit: Payer: Self-pay | Admitting: Gastroenterology

## 2020-05-01 ENCOUNTER — Other Ambulatory Visit: Payer: Self-pay | Admitting: Physician Assistant

## 2020-05-01 DIAGNOSIS — E1122 Type 2 diabetes mellitus with diabetic chronic kidney disease: Secondary | ICD-10-CM

## 2020-05-03 ENCOUNTER — Other Ambulatory Visit: Payer: Self-pay | Admitting: Physician Assistant

## 2020-05-03 ENCOUNTER — Other Ambulatory Visit: Payer: Self-pay | Admitting: Gastroenterology

## 2020-05-09 ENCOUNTER — Telehealth: Payer: Self-pay | Admitting: Physician Assistant

## 2020-05-09 NOTE — Telephone Encounter (Signed)
Noted.  Patient has a follow-up appointment with me early next week.  We will try to convince him to take his medications and follow medical directions at appointment.  Unfortunately if he is refusing to take medicine there is not much I can do to help him.

## 2020-05-09 NOTE — Telephone Encounter (Signed)
Okay for verbal order

## 2020-05-09 NOTE — Telephone Encounter (Signed)
Patient is not taking his fluid pills x 1 week.  Patient is not taking metformin x 1 week and patient is not taking potassium x 1 week.  Nurse tried to express importance of taking medication and patient told her that he didn't care and that he wasn't going back on any of the medication.  Patient says that it makes him pee too much at night.  Nurse told him to take it before 2:00 pm daily and that should help.  Patient refused.

## 2020-05-09 NOTE — Telephone Encounter (Signed)
Needs verbal order to see 2x week for 4 weeks and 1 x week for 1 week

## 2020-05-10 NOTE — Telephone Encounter (Signed)
Left detailed message on Amy/PT for verbal orders for PT

## 2020-05-15 ENCOUNTER — Other Ambulatory Visit: Payer: Self-pay

## 2020-05-15 ENCOUNTER — Ambulatory Visit (INDEPENDENT_AMBULATORY_CARE_PROVIDER_SITE_OTHER): Payer: Medicare Other | Admitting: Physician Assistant

## 2020-05-15 ENCOUNTER — Encounter: Payer: Self-pay | Admitting: Physician Assistant

## 2020-05-15 VITALS — BP 104/70 | HR 77 | Temp 98.6°F | Resp 16 | Ht 69.0 in | Wt 208.0 lb

## 2020-05-15 DIAGNOSIS — E1122 Type 2 diabetes mellitus with diabetic chronic kidney disease: Secondary | ICD-10-CM

## 2020-05-15 DIAGNOSIS — E1159 Type 2 diabetes mellitus with other circulatory complications: Secondary | ICD-10-CM

## 2020-05-15 DIAGNOSIS — I5031 Acute diastolic (congestive) heart failure: Secondary | ICD-10-CM

## 2020-05-15 DIAGNOSIS — R2681 Unsteadiness on feet: Secondary | ICD-10-CM | POA: Diagnosis not present

## 2020-05-15 DIAGNOSIS — I152 Hypertension secondary to endocrine disorders: Secondary | ICD-10-CM | POA: Diagnosis not present

## 2020-05-15 DIAGNOSIS — N182 Chronic kidney disease, stage 2 (mild): Secondary | ICD-10-CM | POA: Diagnosis not present

## 2020-05-15 DIAGNOSIS — Z794 Long term (current) use of insulin: Secondary | ICD-10-CM | POA: Diagnosis not present

## 2020-05-15 LAB — CBC WITH DIFFERENTIAL/PLATELET
Basophils Absolute: 0.1 10*3/uL (ref 0.0–0.1)
Basophils Relative: 1.2 % (ref 0.0–3.0)
Eosinophils Absolute: 0.1 10*3/uL (ref 0.0–0.7)
Eosinophils Relative: 1.5 % (ref 0.0–5.0)
HCT: 41 % (ref 39.0–52.0)
Hemoglobin: 13.5 g/dL (ref 13.0–17.0)
Lymphocytes Relative: 13.8 % (ref 12.0–46.0)
Lymphs Abs: 1.1 10*3/uL (ref 0.7–4.0)
MCHC: 33 g/dL (ref 30.0–36.0)
MCV: 99.8 fl (ref 78.0–100.0)
Monocytes Absolute: 0.7 10*3/uL (ref 0.1–1.0)
Monocytes Relative: 8.6 % (ref 3.0–12.0)
Neutro Abs: 6.1 10*3/uL (ref 1.4–7.7)
Neutrophils Relative %: 74.9 % (ref 43.0–77.0)
Platelets: 203 10*3/uL (ref 150.0–400.0)
RBC: 4.11 Mil/uL — ABNORMAL LOW (ref 4.22–5.81)
RDW: 14.7 % (ref 11.5–15.5)
WBC: 8.2 10*3/uL (ref 4.0–10.5)

## 2020-05-15 LAB — COMPREHENSIVE METABOLIC PANEL
ALT: 14 U/L (ref 0–53)
AST: 15 U/L (ref 0–37)
Albumin: 3.6 g/dL (ref 3.5–5.2)
Alkaline Phosphatase: 90 U/L (ref 39–117)
BUN: 12 mg/dL (ref 6–23)
CO2: 30 mEq/L (ref 19–32)
Calcium: 9.2 mg/dL (ref 8.4–10.5)
Chloride: 95 mEq/L — ABNORMAL LOW (ref 96–112)
Creatinine, Ser: 0.64 mg/dL (ref 0.40–1.50)
GFR: 93.57 mL/min (ref >60.00)
Glucose, Bld: 139 mg/dL — ABNORMAL HIGH (ref 70–99)
Potassium: 3.5 mEq/L (ref 3.5–5.1)
Sodium: 135 mEq/L (ref 135–145)
Total Bilirubin: 0.9 mg/dL (ref 0.2–1.2)
Total Protein: 7.5 g/dL (ref 6.0–8.3)

## 2020-05-15 NOTE — Progress Notes (Signed)
Patient with history of afib, HTN, OSA, DM presents to clinic today for follow-up after hospitalization and SNF stay. Patient was evaluated in the ER on 04/16/2020 for worsening weakness and associated increase in lower extremity edema. ER workup revealed bradycardia, mild elevation in BNP, EKG with chronic afib, CXR showing possibility of pneumonia versus vascular congestion. Was admitted for further evaluation and management.   During admission, patient had significant improvement in vascular congestion and swelling with IV furosemide. Echo obtained on 04/17/2020 revealing EF 60-65%, no WMA and trivial MR. Venous duplex negative bilaterally. Was noted to have low magnesium which was repleted. Afib remained stable. Giving his frequent falls was restarted on 81 mg ASA daily but not started on AC. Chlorthalidone and losartan discontinued. Stage II sacral ulcer noted on admission with wound care performed daily with improvement. Felt stable for discharge to SNF on 04/19/2020 for continued care and PT.   During SNF stay patient and wife note the patient continued to improve, doing PT multiple times throughout the week. He continued to improve, gaining strength and improvement in balance. Was discharged home 6 days ago. Wife notes they did check labs on them while he was there but she does not have any records.   Since being home, patient endorses doing great overall. Is walking safely in home without assistance device. Will still grab his walker when having to walk outside since the terrain is a bit uneven. No falls since being home. Still has some difficulty if he has to go up or down a lot fo steps. Continues to have [facility] PT three times weekly for now. Notes his ulcer completely resolved while in SNF. Is hydrating well and eating better. Notes his glucose levels have been 90-100 fasting since being home. Notes he feels better than he has in some time.   Past Medical History:  Diagnosis Date  . Anxiety    . Arthritis   . Atrial fibrillation ([place])   . Cataract   . Depression   . Diabetes mellitus without complication ([place])   . Hyperlipidemia   . Hypertension   . Osteoporosis   . Sleep apnea    no CPAP, sleeps in recliner  . Stroke [facility])    2010- ? TIA    Current Outpatient Medications on File Prior to Visit  Medication Sig Dispense Refill  . allopurinol (ZYLOPRIM) 300 MG tablet TAKE 1 TABLET BY MOUTH EVERY DAY 90 tablet 0  . aspirin EC 81 MG EC tablet Take 1 tablet (81 mg total) by mouth daily. Swallow whole. 30 tablet 11  . Continuous Blood Gluc Receiver (FREESTYLE LIBRE 14 DAY READER) DEVI Apply reader for monitoring of continuous glucose monitoring 1 each 11  . Continuous Blood Gluc Sensor (FREESTYLE LIBRE 14 DAY SENSOR) MISC APPLY EVERY 14 DAY TO CHECK BLOOD SUGAR 1 each 11  . escitalopram (LEXAPRO) 20 MG tablet Take 1 tablet (20 mg total) by mouth daily. 90 tablet 1  . furosemide (LASIX) 20 MG tablet Take 1 tablet (20 mg total) by mouth daily. 30 tablet 1  . insulin NPH Human (NOVOLIN N) 100 UNIT/ML injection Inject 35 Units into the skin daily as needed (for sugar).    . mesalamine (LIALDA) 1.2 g EC tablet TAKE 4 TABLETS BY MOUTH DAILY WITH BREAKFAST. 360 tablet 1  . metoprolol tartrate (LOPRESSOR) 50 MG tablet Take 1 tablet (50 mg total) by mouth 2 (two) times daily. 60 tablet 1  . potassium chloride (KLOR-CON) 10 MEQ tablet Take  1 tablet (10 mEq total) by mouth daily. 30 tablet 0  . traMADol (ULTRAM) 50 MG tablet Take 1 tablet (50 mg total) by mouth every 6 (six) hours as needed for moderate pain. 10 tablet 0  . traZODone (DESYREL) 100 MG tablet Take 1 tablet (100 mg total) by mouth at bedtime. 90 tablet 1  . insulin regular (NOVOLIN R) 100 units/mL injection Inject into the skin 3 (three) times daily as needed for high blood sugar. (Patient not taking: Reported on 05/15/2020)     No current facility-administered medications on file prior to visit.    Allergies  Allergen  Reactions  . Lisinopril Swelling and Other (See Comments)    angioedema    Family History  Problem Relation Age of Onset  . Arthritis Mother   . COPD Mother   . Early death Mother   . Stroke Father   . Colon cancer Father   . Prostate cancer Father   . Rectal cancer Father   . Arthritis Brother   . Stroke Brother   . Esophageal cancer Neg Hx   . Pancreatic cancer Neg Hx   . Stomach cancer Neg Hx   . Liver disease Neg Hx     Social History   Socioeconomic History  . Marital status: Married    Spouse name: [place]  . Number of children: Not on file  . Years of education: Not on file  . Highest education level: Not on file  Occupational History  . Occupation: retired  Tobacco Use  . Smoking status: Never Smoker  . Smokeless tobacco: Never Used  Vaping Use  . Vaping Use: Never used  Substance and Sexual Activity  . Alcohol use: Yes    Comment: occasionally  . Drug use: Not Currently  . Sexual activity: Not Currently  Other Topics Concern  . Not on file  Social History Narrative  . Not on file   Social Determinants of Health   Financial Resource Strain: Low Risk   . Difficulty of Paying Living Expenses: Not hard at all  Food Insecurity: No Food Insecurity  . Worried About Charity fundraiser in the Last Year: Never true  . Ran Out of Food in the Last Year: Never true  Transportation Needs: No Transportation Needs  . Lack of Transportation (Medical): No  . Lack of Transportation (Non-Medical): No  Physical Activity: Inactive  . Days of Exercise per Week: 0 days  . Minutes of Exercise per Session: 0 min  Stress: No Stress Concern Present  . Feeling of Stress : Not at all  Social Connections: Moderately Isolated  . Frequency of Communication with Friends and Family: More than three times a week  . Frequency of Social Gatherings with Friends and Family: Once a week  . Attends Religious Services: Never  . Active Member of Clubs or Organizations: No  . Attends  Archivist Meetings: Never  . Marital Status: Married   Review of Systems - See HPI.  All other ROS are negative.  BP 104/70   Pulse 77   Temp 98.6 F (37 C) (Temporal)   Resp 16   Ht _0  (1.753 m)   Wt 208 lb (94.3 kg)   SpO2 97%   BMI 30.72 kg/m   Physical Exam Vitals reviewed.  Constitutional:      Appearance: Normal appearance.  HENT:     Head: Normocephalic and atraumatic.     Right Ear: Tympanic membrane normal.     Left  Ear: Tympanic membrane normal.  Cardiovascular:     Rate and Rhythm: Rhythm irregular.     Pulses: Normal pulses.     Heart sounds: Normal heart sounds.  Pulmonary:     Effort: Pulmonary effort is normal.     Breath sounds: Normal breath sounds.  Musculoskeletal:     Cervical back: Neck supple.  Neurological:     General: No focal deficit present.     Mental Status: He is alert and oriented to person, place, and time. Mental status is at baseline.     Cranial Nerves: No cranial nerve deficit.     Motor: No weakness.     Gait: Gait normal.     Recent Results (from the past 2160 hour(s))  Hepatic function panel     Status: Abnormal   Collection Time: 02/27/20 10:01 AM  Result Value Ref Range   Total Bilirubin 1.2 0.2 - 1.2 mg/dL   Bilirubin, Direct 0.4 (H) 0.0 - 0.3 mg/dL   Alkaline Phosphatase 96 39 - 117 U/L   AST 33 0 - 37 U/L   ALT 42 0 - 53 U/L   Total Protein 6.6 6.0 - 8.3 g/dL   Albumin 3.6 3.5 - 5.2 g/dL  Lipid panel     Status: Abnormal   Collection Time: 02/27/20 10:01 AM  Result Value Ref Range   Cholesterol 209 (H) 0 - 200 mg/dL    Comment: ATP III Classification       Desirable:  < 200 mg/dL               Borderline High:  200 - 239 mg/dL          High:  > = 240 mg/dL   Triglycerides 116.0 0.0 - 149.0 mg/dL    Comment: Normal:  <150 mg/dLBorderline High:  150 - 199 mg/dL   HDL 128.30 >39.00 mg/dL   VLDL 23.2 0.0 - 40.0 mg/dL   LDL Cholesterol 57 0 - 99 mg/dL   Total CHOL/HDL Ratio 2     Comment:                 Men          Women1/2 Average Risk     3.4          3.3Average Risk          5.0          4.42X Average Risk          9.6          7.13X Average Risk          15.0          11.0                       NonHDL 80.60     Comment: NOTE:  Non-HDL goal should be 30 mg/dL higher than patient's LDL goal (i.e. LDL goal of < 70 mg/dL, would have non-HDL goal of < 100 mg/dL)  CBG monitoring, ED     Status: Abnormal   Collection Time: 04/16/20 11:44 AM  Result Value Ref Range   Glucose-Capillary 172 (H) 70 - 99 mg/dL    Comment: Glucose reference range applies only to samples taken after fasting for at least 8 hours.  Urinalysis, Routine w reflex microscopic Urine, Clean Catch     Status: Abnormal   Collection Time: 04/16/20 12:23 PM  Result Value Ref Range   Color, Urine YELLOW YELLOW  APPearance CLEAR CLEAR   Specific Gravity, Urine >1.030 (H) 1.005 - 1.030   pH 5.0 5.0 - 8.0   Glucose, UA NEGATIVE NEGATIVE mg/dL   Hgb urine dipstick NEGATIVE NEGATIVE   Bilirubin Urine SMALL (A) NEGATIVE   Ketones, ur NEGATIVE NEGATIVE mg/dL   Protein, ur NEGATIVE NEGATIVE mg/dL   Nitrite NEGATIVE NEGATIVE   Leukocytes,Ua NEGATIVE NEGATIVE    Comment: Microscopic not done on urines with negative protein, blood, leukocytes, nitrite, or glucose < 500 mg/dL. Performed at [facility], [address], [place], [place] [address]   CBC with Differential     Status: Abnormal   Collection Time: 04/16/20 12:44 PM  Result Value Ref Range   WBC 9.2 4.0 - 10.5 K/uL   RBC 3.77 (L) 4.22 - 5.81 MIL/uL   Hemoglobin 13.0 13.0 - 17.0 g/dL   HCT 39.6 39.0 - 52.0 %   MCV 105.0 (H) 80.0 - 100.0 fL   MCH 34.5 (H) 26.0 - 34.0 pg   MCHC 32.8 30.0 - 36.0 g/dL   RDW 14.5 11.5 - 15.5 %   Platelets 204 150 - 400 K/uL   nRBC 0.0 0.0 - 0.2 %   Neutrophils Relative % 77 %   Neutro Abs 7.2 1.7 - 7.7 K/uL   Lymphocytes Relative 12 %   Lymphs Abs 1.1 0.7 - 4.0 K/uL   Monocytes Relative 7 %   Monocytes Absolute 0.7 0.1 - 1.0 K/uL    Eosinophils Relative 1 %   Eosinophils Absolute 0.1 0.0 - 0.5 K/uL   Basophils Relative 1 %   Basophils Absolute 0.1 0.0 - 0.1 K/uL   Immature Granulocytes 2 %   Abs Immature Granulocytes 0.17 (H) 0.00 - 0.07 K/uL    Comment: Performed at [facility], [address], [place], [place] [address]  Comprehensive metabolic panel     Status: Abnormal   Collection Time: 04/16/20 12:44 PM  Result Value Ref Range   Sodium 133 (L) 135 - 145 mmol/L   Potassium 4.0 3.5 - 5.1 mmol/L   Chloride 96 (L) 98 - 111 mmol/L   CO2 27 22 - 32 mmol/L   Glucose, Bld 185 (H) 70 - 99 mg/dL    Comment: Glucose reference range applies only to samples taken after fasting for at least 8 hours.   BUN 18 8 - 23 mg/dL   Creatinine, Ser 0.83 0.61 - 1.24 mg/dL   Calcium 8.9 8.9 - 10.3 mg/dL   Total Protein 7.0 6.5 - 8.1 g/dL   Albumin 2.8 (L) 3.5 - 5.0 g/dL   AST 19 15 - 41 U/L   ALT 17 0 - 44 U/L   Alkaline Phosphatase 88 38 - 126 U/L   Total Bilirubin 0.7 0.3 - 1.2 mg/dL   GFR, Estimated >60 >60 mL/min    Comment: (NOTE) Calculated using the CKD-EPI Creatinine Equation (2021)    Anion gap 10 5 - 15    Comment: Performed at [facility], [address], [place], [place] [address]  Lipase, blood     Status: None   Collection Time: 04/16/20 12:44 PM  Result Value Ref Range   Lipase 22 11 - 51 U/L    Comment: Performed at [facility], [address], [place], [place] [address]  Troponin I (High Sensitivity)     Status: None   Collection Time: 04/16/20 12:44 PM  Result Value Ref Range   Troponin I (High Sensitivity) 5 <18 ng/L    Comment: (NOTE) Elevated high sensitivity  troponin I (hsTnI) values and significant  changes across serial measurements may suggest ACS but many other  chronic and acute conditions are known to elevate hsTnI results.  Refer to the "Links" section for chest pain algorithms and additional  guidance. Performed at [facility], [address] [place], [place] [address]   Ammonia      Status: None   Collection Time: 04/16/20 12:44 PM  Result Value Ref Range   Ammonia 12 9 - 35 umol/L    Comment: Performed at [facility], [address], [place], [place] [address]  Ethanol     Status: None   Collection Time: 04/16/20 12:44 PM  Result Value Ref Range   Alcohol, Ethyl (B) <10 <10 mg/dL    Comment: (NOTE) Lowest detectable limit for serum alcohol is 10 mg/dL.  For medical purposes only. Performed at [facility], [address], [place], [place] [address]   Magnesium     Status: Abnormal   Collection Time: 04/16/20 12:44 PM  Result Value Ref Range   Magnesium 1.4 (L) 1.7 - 2.4 mg/dL    Comment: Performed at [facility], [address], [place], [place] [address]  Brain natriuretic peptide     Status: Abnormal   Collection Time: 04/16/20 12:44 PM  Result Value Ref Range   B Natriuretic Peptide 149.0 (H) 0.0 - 100.0 pg/mL    Comment: Performed at [facility], [address], [place], [place] [address]  TSH     Status: None   Collection Time: 04/16/20 12:44 PM  Result Value Ref Range   TSH 2.347 0.350 - 4.500 uIU/mL    Comment: Performed by a 3rd Generation assay with a functional sensitivity of <=0.01 uIU/mL. Performed at [facility], [address], [place], [place] [address]   Resp Panel by RT-PCR (Flu A&B, Covid) Nasopharyngeal Swab     Status: None   Collection Time: 04/16/20  1:40 PM   Specimen: Nasopharyngeal Swab; Nasopharyngeal(NP) swabs in vial transport medium  Result Value Ref Range   SARS Coronavirus 2 by RT PCR NEGATIVE NEGATIVE    Comment: (NOTE) SARS-CoV-2 target nucleic acids are NOT DETECTED.  The SARS-CoV-2 RNA is generally detectable in upper respiratory specimens during the acute phase of infection. The lowest concentration of SARS-CoV-2 viral copies this assay can detect is 138 copies/mL. A negative result does not preclude SARS-Cov-2 infection and should not be used as the sole basis for treatment or other patient management decisions. A  negative result may occur with  improper specimen collection/handling, submission of specimen other than nasopharyngeal swab, presence of viral mutation(s) within the areas targeted by this assay, and inadequate number of viral copies(<138 copies/mL). A negative result must be combined with clinical observations, patient history, and epidemiological information. The expected result is Negative.  Fact Sheet for Patients:  EntrepreneurPulse.com.au  Fact Sheet for Healthcare Providers:  IncredibleEmployment.be  This test is no t yet approved or cleared by the [place] FDA and  has been authorized for detection and/or diagnosis of SARS-CoV-2 by FDA under an Emergency Use Authorization (EUA). This EUA will remain  in effect (meaning this test can be used) for the duration of the COVID-19 declaration under Section 564(b)(1) of the Act, 21 U.S.C.section 360bbb-3(b)(1), unless the authorization is terminated  or revoked sooner.       Influenza A by PCR NEGATIVE NEGATIVE   Influenza B by PCR NEGATIVE NEGATIVE    Comment: (NOTE) The Xpert Xpress SARS-CoV-2/FLU/RSV plus assay is intended as an  aid in the diagnosis of influenza from Nasopharyngeal swab specimens and should not be used as a sole basis for treatment. Nasal washings and aspirates are unacceptable for Xpert Xpress SARS-CoV-2/FLU/RSV testing.  Fact Sheet for Patients: EntrepreneurPulse.com.au  Fact Sheet for Healthcare Providers: IncredibleEmployment.be  This test is not yet approved or cleared by the [place] FDA and has been authorized for detection and/or diagnosis of SARS-CoV-2 by FDA under an Emergency Use Authorization (EUA). This EUA will remain in effect (meaning this test can be used) for the duration of the COVID-19 declaration under Section 564(b)(1) of the Act, 21 U.S.C. section 360bbb-3(b)(1), unless the authorization is terminated  or revoked.  Performed at [facility], [address], [place], [place] [address]   Rapid urine drug screen (hospital performed)     Status: None   Collection Time: 04/16/20  3:00 PM  Result Value Ref Range   Opiates NONE DETECTED NONE DETECTED   Cocaine NONE DETECTED NONE DETECTED   Benzodiazepines NONE DETECTED NONE DETECTED   Amphetamines NONE DETECTED NONE DETECTED   Tetrahydrocannabinol NONE DETECTED NONE DETECTED   Barbiturates NONE DETECTED NONE DETECTED    Comment: (NOTE) DRUG SCREEN FOR MEDICAL PURPOSES ONLY.  IF CONFIRMATION IS NEEDED FOR ANY PURPOSE, NOTIFY LAB WITHIN 5 DAYS.  LOWEST DETECTABLE LIMITS FOR URINE DRUG SCREEN Drug Class                     Cutoff (ng/mL) Amphetamine and metabolites    1000 Barbiturate and metabolites    200 Benzodiazepine                 078 Tricyclics and metabolites     300 Opiates and metabolites        300 Cocaine and metabolites        300 THC                            50 Performed at [facility], [address], [place], [place] [address]   Glucose, capillary     Status: Abnormal   Collection Time: 04/16/20 11:08 PM  Result Value Ref Range   Glucose-Capillary 225 (H) 70 - 99 mg/dL    Comment: Glucose reference range applies only to samples taken after fasting for at least 8 hours.  Glucose, capillary     Status: Abnormal   Collection Time: 04/17/20  7:35 AM  Result Value Ref Range   Glucose-Capillary 161 (H) 70 - 99 mg/dL    Comment: Glucose reference range applies only to samples taken after fasting for at least 8 hours.  ECHOCARDIOGRAM COMPLETE     Status: None   Collection Time: 04/17/20  9:27 AM  Result Value Ref Range   Weight 3,379.21 oz   Height 69 in   BP 114/68 mmHg   Area-P 1/2 4.41 cm2   S' Lateral 2.90 cm   AR max vel 2.75 cm2   AV Area mean vel 2.22 cm2   AV Area VTI 2.24 cm2   Ao pk vel 1.11 m/s   AV Peak grad 4.9 mmHg   AV Mean grad 2.5 mmHg  Glucose, capillary     Status: Abnormal   Collection  Time: 04/17/20 11:13 AM  Result Value Ref Range   Glucose-Capillary 170 (H) 70 - 99 mg/dL    Comment: Glucose reference range applies only to samples taken after fasting for at least 8 hours.  Glucose, capillary  Status: Abnormal   Collection Time: 04/17/20  4:42 PM  Result Value Ref Range   Glucose-Capillary 127 (H) 70 - 99 mg/dL    Comment: Glucose reference range applies only to samples taken after fasting for at least 8 hours.  Glucose, capillary     Status: Abnormal   Collection Time: 04/17/20  9:16 PM  Result Value Ref Range   Glucose-Capillary 159 (H) 70 - 99 mg/dL    Comment: Glucose reference range applies only to samples taken after fasting for at least 8 hours.   Comment 1 Notify RN    Comment 2 Document in Chart   Basic metabolic panel     Status: Abnormal   Collection Time: 04/17/20  9:39 PM  Result Value Ref Range   Sodium 135 135 - 145 mmol/L   Potassium 3.4 (L) 3.5 - 5.1 mmol/L   Chloride 97 (L) 98 - 111 mmol/L   CO2 28 22 - 32 mmol/L   Glucose, Bld 173 (H) 70 - 99 mg/dL    Comment: Glucose reference range applies only to samples taken after fasting for at least 8 hours.   BUN 17 8 - 23 mg/dL   Creatinine, Ser 0.89 0.61 - 1.24 mg/dL   Calcium 8.6 (L) 8.9 - 10.3 mg/dL   GFR, Estimated >60 >60 mL/min    Comment: (NOTE) Calculated using the CKD-EPI Creatinine Equation (2021)    Anion gap 10 5 - 15    Comment: Performed at [facility], [address], [place], [place] [address]  CBC     Status: Abnormal   Collection Time: 04/18/20  5:21 AM  Result Value Ref Range   WBC 7.1 4.0 - 10.5 K/uL   RBC 3.56 (L) 4.22 - 5.81 MIL/uL   Hemoglobin 12.1 (L) 13.0 - 17.0 g/dL   HCT 38.2 (L) 39.0 - 52.0 %   MCV 107.3 (H) 80.0 - 100.0 fL   MCH 34.0 26.0 - 34.0 pg   MCHC 31.7 30.0 - 36.0 g/dL   RDW 14.5 11.5 - 15.5 %   Platelets 200 150 - 400 K/uL   nRBC 0.0 0.0 - 0.2 %    Comment: Performed at [facility], [address] [place], [place] [address]  Comprehensive  metabolic panel     Status: Abnormal   Collection Time: 04/18/20  5:21 AM  Result Value Ref Range   Sodium 137 135 - 145 mmol/L   Potassium 4.3 3.5 - 5.1 mmol/L    Comment: DELTA CHECK NOTED   Chloride 97 (L) 98 - 111 mmol/L   CO2 30 22 - 32 mmol/L   Glucose, Bld 153 (H) 70 - 99 mg/dL    Comment: Glucose reference range applies only to samples taken after fasting for at least 8 hours.   BUN 15 8 - 23 mg/dL   Creatinine, Ser 0.84 0.61 - 1.24 mg/dL   Calcium 8.8 (L) 8.9 - 10.3 mg/dL   Total Protein 6.6 6.5 - 8.1 g/dL   Albumin 2.7 (L) 3.5 - 5.0 g/dL   AST 18 15 - 41 U/L   ALT 14 0 - 44 U/L   Alkaline Phosphatase 74 38 - 126 U/L   Total Bilirubin 0.9 0.3 - 1.2 mg/dL   GFR, Estimated >60 >60 mL/min    Comment: (NOTE) Calculated using the CKD-EPI Creatinine Equation (2021)    Anion gap 10 5 - 15    Comment: Performed at [facility], [address] [place], [place] [address]  Hemoglobin A1c  Status: Abnormal   Collection Time: 04/18/20  7:02 AM  Result Value Ref Range   Hgb A1c MFr Bld 6.7 (H) 4.8 - 5.6 %    Comment: (NOTE) Pre diabetes:          5.7%-6.4%  Diabetes:              >6.4%  Glycemic control for   <7.0% adults with diabetes    Mean Plasma Glucose 145.59 mg/dL    Comment: Performed at [facility] [address] [place], [place] [address]  Vitamin B12     Status: None   Collection Time: 04/18/20  7:02 AM  Result Value Ref Range   Vitamin B-12 405 180 - 914 pg/mL    Comment: (NOTE) This assay is not validated for testing neonatal or myeloproliferative syndrome specimens for Vitamin B12 levels. Performed at [facility], [address], [place], [place] [address]   Folate     Status: None   Collection Time: 04/18/20  7:02 AM  Result Value Ref Range   Folate 8.5 >5.9 ng/mL    Comment: Performed at [facility], [address], [place], [place] [address]  TSH     Status: None   Collection Time: 04/18/20  7:02 AM  Result Value Ref Range   TSH 2.370  0.350 - 4.500 uIU/mL    Comment: Performed by a 3rd Generation assay with a functional sensitivity of <=0.01 uIU/mL. Performed at [facility], [address], [place], [place] [address]   T4, free     Status: None   Collection Time: 04/18/20  7:02 AM  Result Value Ref Range   Free T4 0.94 0.61 - 1.12 ng/dL    Comment: (NOTE) Biotin ingestion may interfere with free T4 tests. If the results are inconsistent with the TSH level, previous test results, or the clinical presentation, then consider biotin interference. If needed, order repeat testing after stopping biotin. Performed at [facility], [place] [address] [place], [place] [address]   Glucose, capillary     Status: Abnormal   Collection Time: 04/18/20  8:17 AM  Result Value Ref Range   Glucose-Capillary 154 (H) 70 - 99 mg/dL    Comment: Glucose reference range applies only to samples taken after fasting for at least 8 hours.   Comment 1 Notify RN    Comment 2 Document in Chart   Glucose, capillary     Status: Abnormal   Collection Time: 04/18/20 11:33 AM  Result Value Ref Range   Glucose-Capillary 156 (H) 70 - 99 mg/dL    Comment: Glucose reference range applies only to samples taken after fasting for at least 8 hours.   Comment 1 Notify RN    Comment 2 Repeat Test    Comment 3 Call MD NNP PA CNM   Protein / creatinine ratio, urine     Status: None   Collection Time: 04/18/20  4:17 PM  Result Value Ref Range   Creatinine, Urine 188.58 mg/dL   Total Protein, Urine 14 mg/dL    Comment: NO NORMAL RANGE ESTABLISHED FOR THIS TEST   Protein Creatinine Ratio 0.07 0.00 - 0.15 mg/mg[Cre]    Comment: Performed at [facility], [address], [place], [place] [address]  Glucose, capillary     Status: Abnormal   Collection Time: 04/18/20  4:46 PM  Result Value Ref Range   Glucose-Capillary 184 (H) 70 - 99 mg/dL    Comment: Glucose reference range applies only to samples  taken after fasting for at least 8 hours.   Comment 1  Notify RN    Comment 2 Document in Chart   Glucose, capillary     Status: Abnormal   Collection Time: 04/18/20  9:38 PM  Result Value Ref Range   Glucose-Capillary 134 (H) 70 - 99 mg/dL    Comment: Glucose reference range applies only to samples taken after fasting for at least 8 hours.   Comment 1 Notify RN    Comment 2 Document in Chart   Basic metabolic panel     Status: Abnormal   Collection Time: 04/19/20  6:01 AM  Result Value Ref Range   Sodium 133 (L) 135 - 145 mmol/L   Potassium 3.2 (L) 3.5 - 5.1 mmol/L    Comment: DELTA CHECK NOTED   Chloride 95 (L) 98 - 111 mmol/L   CO2 29 22 - 32 mmol/L   Glucose, Bld 176 (H) 70 - 99 mg/dL    Comment: Glucose reference range applies only to samples taken after fasting for at least 8 hours.   BUN 17 8 - 23 mg/dL   Creatinine, Ser 0.87 0.61 - 1.24 mg/dL   Calcium 8.7 (L) 8.9 - 10.3 mg/dL   GFR, Estimated >60 >60 mL/min    Comment: (NOTE) Calculated using the CKD-EPI Creatinine Equation (2021)    Anion gap 9 5 - 15    Comment: Performed at [facility], [address] [place], [place] [address]  Magnesium     Status: Abnormal   Collection Time: 04/19/20  6:01 AM  Result Value Ref Range   Magnesium 1.4 (L) 1.7 - 2.4 mg/dL    Comment: Performed at [facility], [address], [place], [place] [address]  Glucose, capillary     Status: Abnormal   Collection Time: 04/19/20  8:09 AM  Result Value Ref Range   Glucose-Capillary 166 (H) 70 - 99 mg/dL    Comment: Glucose reference range applies only to samples taken after fasting for at least 8 hours.  Glucose, capillary     Status: Abnormal   Collection Time: 04/19/20 11:18 AM  Result Value Ref Range   Glucose-Capillary 173 (H) 70 - 99 mg/dL    Comment: Glucose reference range applies only to samples taken after fasting for at least 8 hours.    Assessment/Plan: 1. Hypertension associated with diabetes ([place]) BP stable today. Will remain off of thiazide. Will also remain off of losartan.  Giving diabetes, should consider very low dose ACEI/ARB in the future. Will recheck labs today.   2. Acute diastolic CHF (congestive heart failure) (HCC) Rate controlled afib. Recent stable Echo. Is up and moving around much more, helping with swelling. Legs much improved from previous visits. Recommend we move Furosemide to PRN use. Start daily weights. Keep monitoring I/O. Repeat labs today. Follow-up with Cardiology as scheduled.  - Comp Met (CMET)  3. Gait instability Much improved. Continue HH PT.   4. Type 2 diabetes mellitus with stage 2 chronic kidney disease, with long-term current use of insulin ([place]) Will repeat labs today to include fructosamine level to get a better average of recent glucose levels to ensure no further changes needed at present.  - CBC with Differential/Platelet - Comp Met (CMET) - Fructosamine  This visit occurred during the SARS-CoV-2 public health emergency.  Safety protocols were in place, including screening questions prior to the visit, additional usage of staff PPE, and extensive cleaning of exam room while observing appropriate contact time as indicated  for disinfecting solutions.     Leeanne Rio, PA-C

## 2020-05-15 NOTE — Patient Instructions (Signed)
Please go to the lab today for blood work.  I will call you with your results. We will alter treatment regimen(s) if indicated by your results.   Please continue current chronic medications. The Furosemide (Lasix) can be used to as-needed if you are not going to take daily. You need to weight yourself daily. If you see increasing weight and fluid on the legs you need to take the Lasix.  Call me for weight gain of > 3 pounds over night or 5 pounds in 3 days.   You will be contacted for further assessment with Neurology.

## 2020-05-18 LAB — FRUCTOSAMINE: Fructosamine: 256 umol/L (ref 205–285)

## 2020-05-31 ENCOUNTER — Other Ambulatory Visit: Payer: Self-pay | Admitting: Physician Assistant

## 2020-06-06 ENCOUNTER — Telehealth: Payer: Self-pay | Admitting: Physician Assistant

## 2020-06-06 NOTE — Telephone Encounter (Signed)
Home Health Verbal Orders  Agency:  [facility] Caller: Contact and title [place] therapist  Requesting OT/ PT/ Skilled nursing/ Social Work/ Speech: continue Physical therapy Reason for Request:    Frequency:  2 x a week for 2 weeks starting 06/12/2019, and then 1x a week for 1 week   HH needs F2F w/in last 30 days

## 2020-06-08 NOTE — Telephone Encounter (Signed)
Gave Amy the verbal ok for PT, per Dr. Birdie Riddle.

## 2020-06-11 ENCOUNTER — Telehealth: Payer: Self-pay | Admitting: Gastroenterology

## 2020-06-11 ENCOUNTER — Telehealth: Payer: Self-pay

## 2020-06-11 NOTE — Telephone Encounter (Signed)
Called pt and relayed Alyssa Allwardt's advice to call his GI doctor. Pt understood.

## 2020-06-11 NOTE — Telephone Encounter (Signed)
Pt states that he has been expriencing rectal bleeding for few days. He would like to be seen asap. Pls call him.

## 2020-06-11 NOTE — Telephone Encounter (Signed)
The pt has BRBPR for the past 4 days with BM.  No other symptoms.  He has never had these symptoms before.  He was offered an appt with PA on 2/17 but declined.  He has an appt with PCP this week and will keep that and call back if he needs to be seen with our office.

## 2020-06-11 NOTE — Telephone Encounter (Signed)
Nurse Assessment Nurse: Kevin Riley RN, Kevin Daniel Date/Time Kevin Daniel Time): 06/11/2020 2:48:35 PM Confirm and document reason for call. If symptomatic, describe symptoms. ---Caller states he is having rectal bleeding that started 3 days ago. States bleeding makes the toilet water red when passing a stool. He has IBS. Does the patient have any new or worsening symptoms? ---Yes Will a triage be completed? ---Yes Related visit to physician within the last 2 weeks? ---No Does the PT have any chronic conditions? (i.e. diabetes, asthma, this includes High risk factors for pregnancy, etc.) ---Yes List chronic conditions. ---IBS, HTN, diabetes Is this a behavioral health or substance abuse call? ---No Guidelines Guideline Title Affirmed Question Affirmed Notes Nurse Date/Time Kevin Daniel Time) Rectal Bleeding [1] MODERATE rectal bleeding (small blood clots, passing blood without stool, or toilet water turns red) AND [2] more than once a day Kevin Daniel, RNGeorgina Daniel 06/11/2020 2:50:58 PM Disp. Time Kevin Daniel Time) Disposition Final User 06/11/2020 2:54:03 PM Go to ED Now Yes Kevin Riley, RN, Kevin Daniel NOTE: All timestamps contained within this report are represented as [place] Standard Time. CONFIDENTIALTY NOTICE: This fax transmission is intended only for the addressee. It contains information that is legally privileged, confidential or otherwise protected from use or disclosure. If you are not the intended recipient, you are strictly prohibited from reviewing, disclosing, copying using or disseminating any of this information or taking any action in reliance on or regarding this information. If you have received this fax in error, please notify us immediately by telephone so that we can arrange for its return to [place]. Phone: [phone], Toll-Free: [phone], Fax: [phone] Page: 2 of 2 Call Id: [id number] [place] Disagree/Comply Disagree Caller Understands Yes PreDisposition Call Doctor Care Advice Given Per  Guideline GO TO ED NOW: * You need to be seen in the Emergency Department. * Go to the ED at ___________ [place] now. Drive carefully. NOTE TO TRIAGER - DRIVING: * Another adult should drive. CARE ADVICE given per Rectal Bleeding (Adult) guideline. Referrals Warm transfer to backlin

## 2020-06-11 NOTE — Telephone Encounter (Signed)
The pt had a colonoscopy 9/21 that showed hemorrhoids.  He will call back if he would like to schedule an appt with our office.

## 2020-06-13 ENCOUNTER — Other Ambulatory Visit: Payer: Self-pay

## 2020-06-13 ENCOUNTER — Telehealth: Payer: Self-pay

## 2020-06-13 ENCOUNTER — Ambulatory Visit (INDEPENDENT_AMBULATORY_CARE_PROVIDER_SITE_OTHER): Payer: Medicare Other | Admitting: Physician Assistant

## 2020-06-13 ENCOUNTER — Encounter: Payer: Self-pay | Admitting: Physician Assistant

## 2020-06-13 VITALS — BP 130/78 | HR 76 | Temp 97.9°F | Resp 16 | Ht 69.0 in | Wt 212.0 lb

## 2020-06-13 DIAGNOSIS — E1169 Type 2 diabetes mellitus with other specified complication: Secondary | ICD-10-CM | POA: Diagnosis not present

## 2020-06-13 DIAGNOSIS — E785 Hyperlipidemia, unspecified: Secondary | ICD-10-CM

## 2020-06-13 DIAGNOSIS — K921 Melena: Secondary | ICD-10-CM

## 2020-06-13 DIAGNOSIS — Z8739 Personal history of other diseases of the musculoskeletal system and connective tissue: Secondary | ICD-10-CM

## 2020-06-13 DIAGNOSIS — I152 Hypertension secondary to endocrine disorders: Secondary | ICD-10-CM

## 2020-06-13 DIAGNOSIS — E1122 Type 2 diabetes mellitus with diabetic chronic kidney disease: Secondary | ICD-10-CM

## 2020-06-13 DIAGNOSIS — Z794 Long term (current) use of insulin: Secondary | ICD-10-CM

## 2020-06-13 DIAGNOSIS — E1159 Type 2 diabetes mellitus with other circulatory complications: Secondary | ICD-10-CM

## 2020-06-13 DIAGNOSIS — F320 Major depressive disorder, single episode, mild: Secondary | ICD-10-CM

## 2020-06-13 DIAGNOSIS — R197 Diarrhea, unspecified: Secondary | ICD-10-CM | POA: Insufficient documentation

## 2020-06-13 DIAGNOSIS — N182 Chronic kidney disease, stage 2 (mild): Secondary | ICD-10-CM

## 2020-06-13 DIAGNOSIS — R2681 Unsteadiness on feet: Secondary | ICD-10-CM

## 2020-06-13 NOTE — Progress Notes (Signed)
Phone: [phone]   Subjective:  Patient presents today to establish care with me as their new primary care provider. Patient was formerly a patient of Elyn Aquas, PA-C.  He is here with his wife.  Chief Complaint  Patient presents with  . Transitions Of Care  . Rectal Bleeding    Stopped 4 days ago. 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 Wants to discuss medication.   . Diabetes    Not having to take the Insulin. BS running 155 and leveling off thru the day.    See problem oriented charting  The following were reviewed and entered/updated in epic: Past Medical History:  Diagnosis Date  . Anxiety   . Arthritis   . Atrial fibrillation ([place])   . Cataract   . Depression   . Diabetes mellitus without complication ([place])   . Hyperlipidemia   . Hypertension   . Osteoporosis   . Sleep apnea    no CPAP, sleeps in recliner  . Stroke [facility])    2010- ? TIA   Patient Active Problem List   Diagnosis Date Noted  . Diarrhea 06/13/2020  . Gait instability   . Acute diastolic CHF (congestive heart failure) ([place]) 04/18/2020  . Fall 04/16/2020  . Unspecified atrial fibrillation ([place]) 04/16/2020  . Pressure injury of skin 04/16/2020  . Pressure injury of skin of sacral region 10/03/2019  . Chronic diarrhea 10/03/2019  . Chronic fatigue 10/03/2019  . Type 2 diabetes mellitus with stage 2 chronic kidney disease, with long-term current use of insulin ([place]) 10/03/2019  . Hypertension associated with diabetes ([place]) 10/03/2019  . Hyperlipidemia associated with type 2 diabetes mellitus ([place]) 10/03/2019   Past Surgical History:  Procedure Laterality Date  . ANKLE FRACTURE SURGERY Left 2020  . APPENDECTOMY  1964  . COLONOSCOPY    . CORONARY ARTERY BYPASS GRAFT  2010   at time of COX-MAZE procedure  . COX-MAZE MICROWAVE ABLATION  2010   for AFib- done at time of Bypass- made scar tissue  . TOE AMPUTATION Left     Family History  Problem Relation Age of Onset  . Arthritis Mother   . COPD Mother   .  Early death Mother   . Stroke Father   . Colon cancer Father   . Prostate cancer Father   . Rectal cancer Father   . Arthritis Brother   . Stroke Brother   . Esophageal cancer Neg Hx   . Pancreatic cancer Neg Hx   . Stomach cancer Neg Hx   . Liver disease Neg Hx     Medications- reviewed and updated Current Outpatient Medications  Medication Sig Dispense Refill  . allopurinol (ZYLOPRIM) 300 MG tablet TAKE 1 TABLET BY MOUTH EVERY DAY 90 tablet 0  . aspirin EC 81 MG EC tablet Take 1 tablet (81 mg total) by mouth daily. Swallow whole. 30 tablet 11  . Continuous Blood Gluc Receiver (FREESTYLE LIBRE 14 DAY READER) DEVI Apply reader for monitoring of continuous glucose monitoring 1 each 11  . Continuous Blood Gluc Sensor (FREESTYLE LIBRE 14 DAY SENSOR) MISC APPLY EVERY 14 DAY TO CHECK BLOOD SUGAR 1 each 11  . escitalopram (LEXAPRO) 20 MG tablet Take 1 tablet (20 mg total) by mouth daily. 90 tablet 1  . furosemide (LASIX) 20 MG tablet Take 1 tablet (20 mg total) by mouth daily. 30 tablet 1  . mesalamine (LIALDA) 1.2 g EC tablet TAKE 4 TABLETS BY MOUTH DAILY WITH BREAKFAST. 360 tablet 1  . metoprolol tartrate (LOPRESSOR) 50 MG  tablet TAKE 1&1/2 TABLETS BY MOUTH TWICE A DAY 270 tablet 1  . potassium chloride (KLOR-CON) 10 MEQ tablet Take 1 tablet (10 mEq total) by mouth daily. 30 tablet 0  . traMADol (ULTRAM) 50 MG tablet Take 1 tablet (50 mg total) by mouth every 6 (six) hours as needed for moderate pain. 10 tablet 0  . traZODone (DESYREL) 100 MG tablet TAKE 1 TABLET BY MOUTH EVERYDAY AT BEDTIME 90 tablet 1  . insulin NPH Human (NOVOLIN N) 100 UNIT/ML injection Inject 35 Units into the skin daily as needed (for sugar). (Patient not taking: Reported on 06/13/2020)    . insulin regular (NOVOLIN R) 100 units/mL injection Inject into the skin 3 (three) times daily as needed for high blood sugar. (Patient not taking: Reported on 06/13/2020)     No current facility-administered medications for this  visit.    Allergies-reviewed and updated Allergies  Allergen Reactions  . Lisinopril Swelling and Other (See Comments)    angioedema    Social History   Social History Narrative  . Not on file   Objective  Objective:  BP 130/78   Pulse 76   Temp 97.9 F (36.6 C) (Temporal)   Resp 16   Ht 5\' 9"  (1.753 m)   Wt 212 lb (96.2 kg)   SpO2 98%   BMI 31.31 kg/m  Gen: NAD, resting comfortably, uses a walker HEENT: Mucous membranes are moist. Oropharynx normal Neck: no thyromegaly CV: RRR no murmurs rubs or gallops Lungs: CTAB no crackles, wheeze, rhonchi Abdomen: soft/nontender/nondistended/normal bowel sounds. No rebound or guarding.  Ext: no edema Skin: warm, dry Neuro: grossly normal, moves all extremities, PERRLA; good strength able to push pull against me   Assessment and Plan:   1. Type 2 diabetes mellitus with stage 2 chronic kidney disease, with long-term current use of insulin (HCC) Monitors daily with Freestyle device. States he takes Novolog injections with mealtimes as needed. This morning's sugar was 113.   CMP Latest Ref Rng & Units 05/15/2020 04/19/2020 04/18/2020  Glucose 70 - 99 mg/dL 139(H) 176(H) 153(H)  BUN 6 - 23 mg/dL 12 17 15   Creatinine 0.40 - 1.50 mg/dL 0.64 0.87 0.84  Sodium 135 - 145 mEq/L 135 133(L) 137  Potassium 3.5 - 5.1 mEq/L 3.5 3.2(L) 4.3  Chloride 96 - 112 mEq/L 95(L) 95(L) 97(L)  CO2 19 - 32 mEq/L 30 29 30   Calcium 8.4 - 10.5 mg/dL 9.2 8.7(L) 8.8(L)  Total Protein 6.0 - 8.3 g/dL 7.5 - 6.6  Total Bilirubin 0.2 - 1.2 mg/dL 0.9 - 0.9  Alkaline Phos 39 - 117 U/L 90 - 74  AST 0 - 37 U/L 15 - 18  ALT 0 - 53 U/L 14 - 14   2. Hypertension associated with diabetes ([place]) Looks great today.  Stable on Lopressor 50 mg 1.5 mg BID.  Does not see a cardiologist.   3. Hyperlipidemia associated with type 2 diabetes mellitus (HCC) Lipid Panel     Component Value Date/Time   CHOL 209 (H) 02/27/2020 1001   TRIG 116.0 02/27/2020 1001   HDL  128.30 02/27/2020 1001   CHOLHDL 2 02/27/2020 1001   VLDL 23.2 02/27/2020 1001   LDLCALC 57 02/27/2020 1001   LDLCALC 117 (H) 09/23/2019 1404   Currently not taking any medication. Atorvastatin caused myalgias. Diet-controlled.   4. Hematochezia Hx of IBS-D per patient. Normal mucous-loose stool yesterday, but hematochezia resolved since phone conversations this week. Denies any dizziness or weakness and no abdominal pain. Colonoscopy  in October 2021 with Dr. Rush Landmark showed some chronic colitis changes. He took Mesalamine for about one month and then D/C on his own because "it wasn't working" and the capsules were hard to swallow. He wants me to Rx Zeposia today, but I informed him I would need to discuss with his GI first as a f/up was not scheduled with them.   5. Gait instability Uses walker. Has home health and PT   6. Personal history of Gout Stable on Allopurinol, hasn't had a flare-up in years.  7. Depression with sleep disturbance Stable on Trazodone 100 mg and Lexapro 20 mg Started in 2020 after his daughter passed away  68. OSA Does not like CPAP. Instead sleeps in recliner and elevates legs at night.   Recommended follow up: Return in about 3 months (around 09/10/2020) for Med check and labs same day. (will need to see another provider at that time while Kupono Marling out).   Future Appointments  Date Time Provider [place]  09/12/2020 10:00 AM Inda Coke, [place] [facility]  12/31/2020 10:30 AM [facility]    This visit occurred during the SARS-CoV-2 public health emergency.  Safety protocols were in place, including screening questions prior to the visit, additional usage of staff PPE, and extensive cleaning of exam room while observing appropriate contact time as indicated for disinfecting solutions.    Return precautions advised.  Islah Eve M Dontavion [place], PA-C

## 2020-06-13 NOTE — Telephone Encounter (Signed)
07/19/20 at 150 pm appt with Dr Rush Landmark letter mailed to the pt with the appt info.

## 2020-06-13 NOTE — Telephone Encounter (Signed)
-----   Message from Loralie Champagne, PA-C sent at 06/13/2020  3:36 PM EST ----- Regarding: RE: Medication management Hi Alyssa!  I am glad that his bleeding has resolved and he is feeling well.  He does need follow-up in our office with Dr. Rush Landmark.  I have never prescribed Zeposia either at this point as it is fairly new.  I would not expect you to.  We will get him in to see Dr. Rush Landmark and discuss this as an option.  I am just going to put him his next available since he is currently feeling well other than some mucousy stools.  He had declined to see an APP when he called here recently and I think that he needs to have further discussion with Dr. Rush Landmark in regards to his diagnosis and treatment plan.  Thank you for reaching out!  Gaetano Net, please schedule the patient to see Dr. Rush Landmark next available for follow-up colitis.  ----- Message ----- From: Fredirick Lathe, PA-C Sent: 06/13/2020   2:21 PM EST To: Loralie Champagne, PA-C Subject: Medication management                          Hi Jessica,  I just had my first visit as a transfer of care visit with Mr. Edgell today in the office.  He had called your office and ours this week for some blood in his stool.  He states that this is completely resolved and he is feeling very well.  He is still having mucousy stools, which he says he saw you for in the fall last year.  It looks like he was started on mesalamine by your office.  He only took this medication for about a month and said it was not working and it was also very hard to swallow the capsules.  He asked me to prescribe Zeposia for him, but this is not something I have prescribed previously.  I wanted to get your input on him and best management for him.  I do not see a follow-up scheduled with your office.  Thanks for your help! Alyssa Allwardt, PA-C

## 2020-06-13 NOTE — Patient Instructions (Addendum)
I will get in touch with your GI specialist Janett Billow Zehr, PA-C) and discuss medication options for you. We will then give you a call back about this.   Please recheck in 3 months with another provider here at [facility] until I am back from maternity leave (for med and lab check - CBC, CMET, LIPID PANEL, HA1C).      Diabetes Mellitus and Nutrition, Adult When you have diabetes, or diabetes mellitus, it is very important to have healthy eating habits because your blood sugar (glucose) levels are greatly affected by what you eat and drink. Eating healthy foods in the right amounts, at about the same times every day, can help you:  Control your blood glucose.  Lower your risk of heart disease.  Improve your blood pressure.  Reach or maintain a healthy weight. What can affect my meal plan? Every person with diabetes is different, and each person has different needs for a meal plan. Your health care provider may recommend that you work with a dietitian to make a meal plan that is best for you. Your meal plan may vary depending on factors such as:  The calories you need.  The medicines you take.  Your weight.  Your blood glucose, blood pressure, and cholesterol levels.  Your activity level.  Other health conditions you have, such as heart or kidney disease. How do carbohydrates affect me? Carbohydrates, also called carbs, affect your blood glucose level more than any other type of food. Eating carbs naturally raises the amount of glucose in your blood. Carb counting is a method for keeping track of how many carbs you eat. Counting carbs is important to keep your blood glucose at a healthy level, especially if you use insulin or take certain oral diabetes medicines. It is important to know how many carbs you can safely have in each meal. This is different for every person. Your dietitian can help you calculate how many carbs you should have at each meal and for each snack. How does  alcohol affect me? Alcohol can cause a sudden decrease in blood glucose (hypoglycemia), especially if you use insulin or take certain oral diabetes medicines. Hypoglycemia can be a life-threatening condition. Symptoms of hypoglycemia, such as sleepiness, dizziness, and confusion, are similar to symptoms of having too much alcohol.  Do not drink alcohol if: ? Your health care provider tells you not to drink. ? You are pregnant, may be pregnant, or are planning to become pregnant.  If you drink alcohol: ? Do not drink on an empty stomach. ? Limit how much you use to:  0-1 drink a day for women.  0-2 drinks a day for men. ? Be aware of how much alcohol is in your drink. In the [place] one drink equals one 12 oz bottle of beer (355 mL), one 5 oz glass of wine (148 mL), or one 1 oz glass of hard liquor (44 mL). ? Keep yourself hydrated with water, diet soda, or unsweetened iced tea.  Keep in mind that regular soda, juice, and other mixers may contain a lot of sugar and must be counted as carbs. What are tips for following this plan? Reading food labels  Start by checking the serving size on the "Nutrition Facts" label of packaged foods and drinks. The amount of calories, carbs, fats, and other nutrients listed on the label is based on one serving of the item. Many items contain more than one serving per package.  Check the total grams (g)  of carbs in one serving. You can calculate the number of servings of carbs in one serving by dividing the total carbs by 15. For example, if a food has 30 g of total carbs per serving, it would be equal to 2 servings of carbs.  Check the number of grams (g) of saturated fats and trans fats in one serving. Choose foods that have a low amount or none of these fats.  Check the number of milligrams (mg) of salt (sodium) in one serving. Most people should limit total sodium intake to less than 2,300 mg per day.  Always check the nutrition information of foods  labeled as "low-fat" or "nonfat." These foods may be higher in added sugar or refined carbs and should be avoided.  Talk to your dietitian to identify your daily goals for nutrients listed on the label. Shopping  Avoid buying canned, pre-made, or processed foods. These foods tend to be high in fat, sodium, and added sugar.  Shop around the outside edge of the grocery store. This is where you will most often find fresh fruits and vegetables, bulk grains, fresh meats, and fresh dairy. Cooking  Use low-heat cooking methods, such as baking, instead of high-heat cooking methods like deep frying.  Cook using healthy oils, such as olive, canola, or sunflower oil.  Avoid cooking with butter, cream, or high-fat meats. Meal planning  Eat meals and snacks regularly, preferably at the same times every day. Avoid going long periods of time without eating.  Eat foods that are high in fiber, such as fresh fruits, vegetables, beans, and whole grains. Talk with your dietitian about how many servings of carbs you can eat at each meal.  Eat 4-6 oz (112-168 g) of lean protein each day, such as lean meat, chicken, fish, eggs, or tofu. One ounce (oz) of lean protein is equal to: ? 1 oz (28 g) of meat, chicken, or fish. ? 1 egg. ?  cup (62 g) of tofu.  Eat some foods each day that contain healthy fats, such as avocado, nuts, seeds, and fish.   What foods should I eat? Fruits Berries. Apples. Oranges. Peaches. Apricots. Plums. Grapes. Mango. Papaya. Pomegranate. Kiwi. Cherries. Vegetables Lettuce. Spinach. Leafy greens, including kale, chard, collard greens, and mustard greens. Beets. Cauliflower. Cabbage. Broccoli. Carrots. Green beans. Tomatoes. Peppers. Onions. Cucumbers. Brussels sprouts. Grains Whole grains, such as whole-wheat or whole-grain bread, crackers, tortillas, cereal, and pasta. Unsweetened oatmeal. Quinoa. Brown or wild rice. Meats and other proteins Seafood. Poultry without skin. Lean  cuts of poultry and beef. Tofu. Nuts. Seeds. Dairy Low-fat or fat-free dairy products such as milk, yogurt, and cheese. The items listed above may not be a complete list of foods and beverages you can eat. Contact a dietitian for more information. What foods should I avoid? Fruits Fruits canned with syrup. Vegetables Canned vegetables. Frozen vegetables with butter or cream sauce. Grains Refined white flour and flour products such as bread, pasta, snack foods, and cereals. Avoid all processed foods. Meats and other proteins Fatty cuts of meat. Poultry with skin. Breaded or fried meats. Processed meat. Avoid saturated fats. Dairy Full-fat yogurt, cheese, or milk. Beverages Sweetened drinks, such as soda or iced tea. The items listed above may not be a complete list of foods and beverages you should avoid. Contact a dietitian for more information. Questions to ask a health care provider  Do I need to meet with a diabetes educator?  Do I need to meet with a dietitian?  What number can I call if I have questions?  When are the best times to check my blood glucose? Where to find more information:  American Diabetes Association: diabetes.org  Academy of Nutrition and Dietetics: www.eatright.[employer] of Diabetes and Digestive and Kidney Diseases: [employer]  Association of Diabetes Care and Education Specialists: www.diabeteseducator.org Summary  It is important to have healthy eating habits because your blood sugar (glucose) levels are greatly affected by what you eat and drink.  A healthy meal plan will help you control your blood glucose and maintain a healthy lifestyle.  Your health care provider may recommend that you work with a dietitian to make a meal plan that is best for you.  Keep in mind that carbohydrates (carbs) and alcohol have immediate effects on your blood glucose levels. It is important to count carbs and to use alcohol carefully. This  information is not intended to replace advice given to you by your health care provider. Make sure you discuss any questions you have with your health care provider. Document Revised: 03/29/2019 Document Reviewed: 03/29/2019 Elsevier Patient Education  2021 [employer].

## 2020-06-15 ENCOUNTER — Telehealth: Payer: Self-pay | Admitting: Emergency Medicine

## 2020-06-15 ENCOUNTER — Encounter: Payer: Self-pay | Admitting: *Deleted

## 2020-06-15 ENCOUNTER — Ambulatory Visit: Payer: Self-pay | Admitting: *Deleted

## 2020-06-15 NOTE — Telephone Encounter (Signed)
-----   Message from Fredirick Lathe, PA-C sent at 06/13/2020  4:11 PM EST ----- Regarding: RE: Medication management Thank you very much! I appreciate your help on this matter.   Alyssa    Charity Tessier, please call and inform patient of Jessica's message and they will be reaching out to him to get scheduled.   ----- Message ----- From: Loralie Champagne, PA-C Sent: 06/13/2020   3:39 PM EST To: Randa Evens Allwardt, PA-C, Timothy Lasso, RN Subject: RE: Medication management                      Hi Alyssa!  I am glad that his bleeding has resolved and he is feeling well.  He does need follow-up in our office with Dr. Rush Landmark.  I have never prescribed Zeposia either at this point as it is fairly new.  I would not expect you to.  We will get him in to see Dr. Rush Landmark and discuss this as an option.  I am just going to put him his next available since he is currently feeling well other than some mucousy stools.  He had declined to see an APP when he called here recently and I think that he needs to have further discussion with Dr. Rush Landmark in regards to his diagnosis and treatment plan.  Thank you for reaching out!  Gaetano Net, please schedule the patient to see Dr. Rush Landmark next available for follow-up colitis.  ----- Message ----- From: Fredirick Lathe, PA-C Sent: 06/13/2020   2:21 PM EST To: Loralie Champagne, PA-C Subject: Medication management                          Hi Jessica,  I just had my first visit as a transfer of care visit with Mr. Dowson today in the office.  He had called your office and ours this week for some blood in his stool.  He states that this is completely resolved and he is feeling very well.  He is still having mucousy stools, which he says he saw you for in the fall last year.  It looks like he was started on mesalamine by your office.  He only took this medication for about a month and said it was not working and it was also very hard to swallow the capsules.   He asked me to prescribe Zeposia for him, but this is not something I have prescribed previously.  I wanted to get your input on him and best management for him.  I do not see a follow-up scheduled with your office.  Thanks for your help! Alyssa Allwardt, PA-C

## 2020-06-15 NOTE — Telephone Encounter (Signed)
Spoke with patient and advised him of his appointment on 07/19/20 at 1:50PM. He should get a appointment reminder in the mail. Dr Rush Landmark will discuss the new medication patient would like to start for his colitis.

## 2020-06-15 NOTE — Chronic Care Management (AMB) (Signed)
Care Management Clinical Social Work Note  06/15/2020 Name: Kevin Daniel MRN: [id number] DOB: [birth year]  Kevin Daniel is a 75 y.o. year old male who is a primary care patient of Allwardt, Randa Evens, PA-C.  The Care Management team was consulted for assistance with chronic disease management and coordination needs.  Engaged with patient by telephone for initial consult, in response to provider referral for social work chronic care management and care coordination services.  Referral received on 06/14/2020, due to Failure to Thrive and Gait Instability; although, CSW referral was originally generated on 04/13/2020.   Consent to Services:  [name] was given information about Care Management services today including:  1. Care Management services includes personalized support from designated clinical staff supervised by his physician, including individualized plan of care and coordination with other care providers. 2. 24/7 contact phone numbers for assistance for urgent and routine care needs. 3. The patient may stop case management services at any time by phone call to the office staff.  Patient politely declined the need for social work services at this time, but gave verbal consent to converse with CSW over the phone today.  Assessment: Review of patient past medical history, allergies, medications, and health status, including review of relevant consultants reports was performed today as part of a comprehensive evaluation and provision of chronic care management and care coordination services.  Patient reported that he was recently hospitalized, then transitioned to [facility] [place], to receive short-term rehabilitative services, prior to returning home to live with wife, Kevin Daniel.  Patient indicated that he is receiving home health physical therapy 3 days per week, which is "greatly benefiting" his strength, mobility, safety,  conditioning, etc.  Patient is now able to ambulate without assistance, but has been encouraged to utilize his walker when necessary.  Patient admitted to taking his medications exactly as prescribed and confirms affordability.  Patient denied needing assistance with any/all of the following services and/or resources: arranging durable medical equipment, in-home care or private duty aid services, transportation, meal preparation, completion of Advanced Directives ([place] documents), counseling/supportive services, financial assistance, housing and/or higher level of care placement.   SDOH (Social Determinants of Health) assessments and interventions performed:  None.  Patient politely declined social work services, resources, and/or assistance at this time.  Advanced Directives Status: Not ready or willing to discuss.  Care Plan  Allergies  Allergen Reactions  . Lisinopril Swelling and Other (See Comments)    angioedema    Outpatient Encounter Medications as of 06/15/2020  Medication Sig Note  . allopurinol (ZYLOPRIM) 300 MG tablet TAKE 1 TABLET BY MOUTH EVERY DAY   . aspirin EC 81 MG EC tablet Take 1 tablet (81 mg total) by mouth daily. Swallow whole.   . Continuous Blood Gluc Receiver (FREESTYLE LIBRE 14 DAY READER) DEVI Apply reader for monitoring of continuous glucose monitoring   . Continuous Blood Gluc Sensor (FREESTYLE LIBRE 14 DAY SENSOR) MISC APPLY EVERY 14 DAY TO CHECK BLOOD SUGAR   . escitalopram (LEXAPRO) 20 MG tablet Take 1 tablet (20 mg total) by mouth daily.   . furosemide (LASIX) 20 MG tablet Take 1 tablet (20 mg total) by mouth daily.   . insulin NPH Human (NOVOLIN N) 100 UNIT/ML injection Inject 35 Units into the skin daily as needed (for sugar). (Patient not taking: Reported on 06/13/2020) 04/16/2020: Pt's wife says he hasn't needed this lately   . insulin regular (NOVOLIN  R) 100 units/mL injection Inject into the skin 3 (three) times daily  as needed for high blood sugar. (Patient not taking: Reported on 06/13/2020) 04/16/2020: Pt's wife says he hasn't needed this lately    . mesalamine (LIALDA) 1.2 g EC tablet TAKE 4 TABLETS BY MOUTH DAILY WITH BREAKFAST.   . metoprolol tartrate (LOPRESSOR) 50 MG tablet TAKE 1&1/2 TABLETS BY MOUTH TWICE A DAY   . potassium chloride (KLOR-CON) 10 MEQ tablet Take 1 tablet (10 mEq total) by mouth daily.   . traMADol (ULTRAM) 50 MG tablet Take 1 tablet (50 mg total) by mouth every 6 (six) hours as needed for moderate pain.   . traZODone (DESYREL) 100 MG tablet TAKE 1 TABLET BY MOUTH EVERYDAY AT BEDTIME    No facility-administered encounter medications on file as of 06/15/2020.    Patient Active Problem List   Diagnosis Date Noted  . Diarrhea 06/13/2020  . Gait instability   . Acute diastolic CHF (congestive heart failure) ([place]) 04/18/2020  . Fall 04/16/2020  . Unspecified atrial fibrillation ([place]) 04/16/2020  . Pressure injury of skin 04/16/2020  . Pressure injury of skin of sacral region 10/03/2019  . Chronic diarrhea 10/03/2019  . Chronic fatigue 10/03/2019  . Type 2 diabetes mellitus with stage 2 chronic kidney disease, with long-term current use of insulin ([place]) 10/03/2019  . Hypertension associated with diabetes ([place]) 10/03/2019  . Hyperlipidemia associated with type 2 diabetes mellitus ([place]) 10/03/2019      Follow Up Plan: Confirmed with patient correct contact information for CSW.  Patient encouraged to contact CSW directly if additional social work needs arise in the near future.  Otherwise, no follow-up is required at this time, and CSW will sign off.  Thank you for the referral and opportunity to work with your clients.  [place], Marta Lamas, [facility] Management Social Worker [place] # [phone]

## 2020-06-22 NOTE — Telephone Encounter (Signed)
Hello All, Looks like he doesn't want to follow up in clinic at this time. I wouldn't prescribe Zeposia without patient failing multiple other oral regimens as I agree there are many risks along with this. Happy to see him in future if he decides to follow up. We can only do as much as a patient allows [place] and help him understand different options of treatment. If he is interested he can be rescheduled. Thanks. GM

## 2020-06-22 NOTE — Telephone Encounter (Signed)
The pt called to cancel his appt with Dr Rush Landmark.  He says he his doing fine and does not want to proceed with any treatment. I discussed with him that alternate treatment options can de discussed at the follow up and he declines and states he wants his appt cancelled and he will call if he has any further problems.

## 2020-06-22 NOTE — Telephone Encounter (Signed)
Patient called to cancel the appointment in the office. Stated the medication has too many side effects and he declines the request.

## 2020-06-27 ENCOUNTER — Telehealth: Payer: Self-pay

## 2020-06-27 NOTE — Telephone Encounter (Signed)
Please advise 

## 2020-06-27 NOTE — Telephone Encounter (Signed)
Amy with [facility] called stating that Pt.'s bp has been running 140-150's/ 80-90's and has bilateral swelling in legs. She wants to know if pt should restart his chlorthalidone (20?) mg. Please call pt back with any questions/ answers.

## 2020-06-27 NOTE — Telephone Encounter (Signed)
I recommend office visit please and bring blood pressure readings.

## 2020-06-28 ENCOUNTER — Other Ambulatory Visit: Payer: Self-pay | Admitting: Physician Assistant

## 2020-06-28 ENCOUNTER — Telehealth (INDEPENDENT_AMBULATORY_CARE_PROVIDER_SITE_OTHER): Payer: Medicare Other | Admitting: Physician Assistant

## 2020-06-28 DIAGNOSIS — E1159 Type 2 diabetes mellitus with other circulatory complications: Secondary | ICD-10-CM

## 2020-06-28 DIAGNOSIS — I5031 Acute diastolic (congestive) heart failure: Secondary | ICD-10-CM

## 2020-06-28 DIAGNOSIS — E876 Hypokalemia: Secondary | ICD-10-CM

## 2020-06-28 DIAGNOSIS — I152 Hypertension secondary to endocrine disorders: Secondary | ICD-10-CM | POA: Diagnosis not present

## 2020-06-28 MED ORDER — SPHYGMOMANOMETER MISC: 1.0000 | 0 refills | Status: AC

## 2020-06-28 MED ORDER — POTASSIUM CHLORIDE CRYS ER 10 MEQ PO TBCR: 10.0000 meq | EXTENDED_RELEASE_TABLET | Freq: Every day | ORAL | 0 refills | Status: DC

## 2020-06-28 MED ORDER — CHLORTHALIDONE 25 MG PO TABS: 25.0000 mg | ORAL_TABLET | Freq: Every day | ORAL | 0 refills | Status: DC

## 2020-06-28 MED ORDER — FUROSEMIDE 20 MG PO TABS: 20.0000 mg | ORAL_TABLET | Freq: Every day | ORAL | 1 refills | Status: DC

## 2020-06-28 NOTE — Patient Instructions (Signed)
I have sent your medications to the pharmacy.  Please start these today.  Monitor your blood pressure daily.  Try to keep your feet elevated while reclining in the chair.  Call if any worsening or change in symptoms.

## 2020-06-28 NOTE — Telephone Encounter (Signed)
Patient scheduled.

## 2020-06-28 NOTE — Progress Notes (Signed)
Virtual Visit via Video Note  I connected with Kevin Daniel on 06/28/20 at  4:00 PM EST by a video enabled telemedicine application and verified that I am speaking with the correct person using two identifiers.  Location: Patient: home Provider: Therapist, music at [facility] present: Patient, his wife, and myself   I discussed the limitations of evaluation and management by telemedicine and the availability of in person appointments. The patient expressed understanding and agreed to proceed.    History of Present Illness: Patient presents with his wife via video call today after phone call from home health care yesterday stating that his blood pressure has been running in the 140s to 150s over 80s over 90s plus increased swelling in his lower extremities.  He does have a history of congestive heart failure and hypertension.  He has been having to sleep in a reclining chair at night.  He wears a CPAP every night which does help.  He denies any chest pain or shortness of breath.  No headaches or vision changes.  No weakness or fatigue more than normal.  He has not been taking any Lasix or his potassium supplement.  He also wants to know if he should go back on his chlorthalidone that he was previously taking.  He is currently taking metoprolol tartrate 50 mg twice daily.   Observations/Objective: BP: 150/90 yesterday  Gen: Awake, alert, no acute distress Resp: Breathing is even and non-labored Psych: calm/pleasant demeanor Neuro: Alert and Oriented x 3, + facial symmetry, speech is clear. Cardio: Bilateral pitting edema in lower legs, ankles, and feet, about 2 to 3+ when his wife presses into his legs.    Assessment and Plan:  1. Acute diastolic CHF (congestive heart failure) ([place]) 2. Hypertension associated with diabetes ([place]) 3. Hypokalemia He does not appear to be in any major distress today.  I recommend that he restart his on Lasix 20 mg daily,  chlorthalidone 25 mg, and potassium chloride 10 M EQ daily.  I sent these prescriptions to his pharmacy today.  I am also going to try to get a home blood pressure device for him to use when home health care is not available.  I would like for him to check this daily and keep a log.  He needs to stay elevating his legs while he is in the recliner and also limit his salt intake.  He could consider compression stockings although this might be a challenge for him and his wife to get on him.  He will go straight to the emergency department for any sudden worsening swelling, chest pain, or shortness of breath.   Follow Up Instructions:    I discussed the assessment and treatment plan with the patient. The patient was provided an opportunity to ask questions and all were answered. The patient agreed with the plan and demonstrated an understanding of the instructions.   The patient was advised to call back or seek an in-person evaluation if the symptoms worsen or if the condition fails to improve as anticipated.  Efton Thomley M Terrea Bruster, PA-C

## 2020-06-30 ENCOUNTER — Encounter: Payer: Self-pay | Admitting: Physician Assistant

## 2020-07-04 ENCOUNTER — Telehealth: Payer: Self-pay

## 2020-07-04 ENCOUNTER — Other Ambulatory Visit: Payer: Self-pay | Admitting: Physician Assistant

## 2020-07-04 DIAGNOSIS — N182 Chronic kidney disease, stage 2 (mild): Secondary | ICD-10-CM

## 2020-07-04 DIAGNOSIS — E1122 Type 2 diabetes mellitus with diabetic chronic kidney disease: Secondary | ICD-10-CM

## 2020-07-04 MED ORDER — LOSARTAN POTASSIUM 50 MG PO TABS: 50.0000 mg | ORAL_TABLET | Freq: Every day | ORAL | 0 refills | Status: DC

## 2020-07-04 NOTE — Telephone Encounter (Signed)
..   LAST APPOINTMENT DATE: 06/27/2020   NEXT APPOINTMENT DATE:@3 /02/2021  MEDICATION:losartan (COZAAR) 50 MG tablet    PHARMACY:[id number] - [place], [place] - [address] [place] [address] [place] [address]    Pt states his Bp is trending high. His most recent bp reading was 161/63

## 2020-07-04 NOTE — Telephone Encounter (Signed)
Rx sent 

## 2020-07-04 NOTE — Telephone Encounter (Signed)
Rx sent for patient. Please start this once daily. Monitor BP at home and call with update next week.

## 2020-07-04 NOTE — Telephone Encounter (Signed)
Rx request not on current med list

## 2020-07-11 ENCOUNTER — Telehealth: Payer: Self-pay

## 2020-07-11 NOTE — Telephone Encounter (Signed)
Home Health Certification or Plan of Care Tracking  This is a Certification and [place] Agency: [facility]   Order Number:  [id number]   Where has form been placed:  Placed up front

## 2020-07-11 NOTE — Telephone Encounter (Signed)
Formed received given to PCP to sign

## 2020-07-12 ENCOUNTER — Ambulatory Visit: Payer: Medicare Other | Admitting: Physician Assistant

## 2020-07-19 ENCOUNTER — Ambulatory Visit: Payer: Medicare Other | Admitting: Gastroenterology

## 2020-07-25 ENCOUNTER — Other Ambulatory Visit: Payer: Self-pay | Admitting: Physician Assistant

## 2020-09-12 ENCOUNTER — Ambulatory Visit: Payer: Medicare Other | Admitting: Physician Assistant

## 2020-09-18 ENCOUNTER — Telehealth: Payer: Self-pay

## 2020-09-18 NOTE — Telephone Encounter (Signed)
Patient is being seen on 5/19.   Nurse Assessment Nurse: Alvis Lemmings, RN, Marcie Bal Date/Time Eilene Ghazi Time): 09/18/2020 1:31:21 PM Confirm and document reason for call. If symptomatic, describe symptoms. 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 Has been going on for months as he is sitting in a lift recliner. Left buttock size of nickle. Right side is about 1". Bleeding just started. Painful. No fever. No odor Does the patient have any new or worsening symptoms? ---Yes Will a triage be completed? ---Yes Related visit to physician within the last 2 weeks? ---No Does the PT have any chronic conditions? (i.e. diabetes, asthma, this includes High risk factors for pregnancy, etc.) ---Yes List chronic conditions. ---Diabetic2, on insulin, CHF, tremors, Is this a behavioral health or substance abuse call? ---No Guidelines Guideline Title Affirmed Question Affirmed Notes Nurse Date/Time (Eastern Time) Sores [1] Small red streak or spreading redness Etter Sjogren 09/18/2020 1:35:16 PM PLEASE NOTE: All timestamps contained within this report are represented as [place] Standard Time. CONFIDENTIALTY NOTICE: This fax transmission is intended only for the addressee. It contains information that is legally privileged, confidential or otherwise protected from use or disclosure. If you are not the intended recipient, you are strictly prohibited from reviewing, disclosing, copying using or disseminating any of this information or taking any action in reliance on or regarding this information. If you have received this fax in error, please notify us immediately by telephone so that we can arrange for its return to [place]. Phone: [phone], Toll-Free: [phone], Fax: [phone] Page: 2 of 2 Call Id: [id number] Guidelines Guideline Title Affirmed Question Affirmed Notes Nurse Date/Time Eilene Ghazi Time) (< 2 inches or  5 cm) AND [2] no fever Disp. Time Eilene Ghazi Time) Disposition Final User 09/18/2020 1:38:02 PM See PCP within 24 Hours Yes Alvis Lemmings, RN, Lenon Oms Disagree/Comply Comply Caller Understands No PreDisposition InappropriateToAsk Care Advice Given Per Guideline SEE PCP WITHIN 24 HOURS: * IF OFFICE WILL BE OPEN: You need to be examined within the next 24 hours. Call your doctor (or NP/PA) when the office opens and make an appointment. CLEANSING: * Wash the infected area with warm water and an antibacterial soap. * Do this three times a day. ANTIBIOTIC OINTMENT: * Apply an over-the-counter antibiotic ointment (e.g., Bacitracin) 3 times per day. * Cover the sore with an adhesive bandage (such as a Band-Aid) to prevent scratching and spread. CARE ADVICE given per Sores (Adult) guideline. * Repeat washing with antibacterial soap, ointment and Band-Aid three times a day. CALL BACK IF: * You become worse * Fever occurs Comments User: Manning Charity, RN Date/Time Eilene Ghazi Time): 09/18/2020 1:42:21 PM called [facility] at [facility], No appt tomorrow,but putting the pt on wait list and will call if appt availabe or they can go to [facility]. Referrals Warm transfer to backline

## 2020-09-20 ENCOUNTER — Other Ambulatory Visit: Payer: Self-pay

## 2020-09-20 ENCOUNTER — Encounter: Payer: Self-pay | Admitting: Family

## 2020-09-20 ENCOUNTER — Ambulatory Visit (INDEPENDENT_AMBULATORY_CARE_PROVIDER_SITE_OTHER): Payer: Medicare Other | Admitting: Family

## 2020-09-20 VITALS — BP 120/84 | HR 64 | Temp 97.7°F | Wt 208.0 lb

## 2020-09-20 DIAGNOSIS — L89159 Pressure ulcer of sacral region, unspecified stage: Secondary | ICD-10-CM | POA: Diagnosis not present

## 2020-09-20 DIAGNOSIS — L89303 Pressure ulcer of unspecified buttock, stage 3: Secondary | ICD-10-CM

## 2020-09-20 DIAGNOSIS — N182 Chronic kidney disease, stage 2 (mild): Secondary | ICD-10-CM

## 2020-09-20 DIAGNOSIS — W19XXXA Unspecified fall, initial encounter: Secondary | ICD-10-CM

## 2020-09-20 DIAGNOSIS — L89322 Pressure ulcer of left buttock, stage 2: Secondary | ICD-10-CM | POA: Diagnosis not present

## 2020-09-20 DIAGNOSIS — E1159 Type 2 diabetes mellitus with other circulatory complications: Secondary | ICD-10-CM

## 2020-09-20 DIAGNOSIS — E1122 Type 2 diabetes mellitus with diabetic chronic kidney disease: Secondary | ICD-10-CM

## 2020-09-20 DIAGNOSIS — Z794 Long term (current) use of insulin: Secondary | ICD-10-CM

## 2020-09-20 MED ORDER — FREESTYLE LIBRE 14 DAY SENSOR MISC: 11 refills | Status: AC

## 2020-09-20 MED ORDER — HYDROCODONE-ACETAMINOPHEN 10-325 MG PO TABS: 1.0000 | ORAL_TABLET | Freq: Three times a day (TID) | ORAL | 0 refills | Status: AC | PRN

## 2020-09-20 MED ORDER — CEPHALEXIN 500 MG PO CAPS: 500.0000 mg | ORAL_CAPSULE | Freq: Three times a day (TID) | ORAL | 0 refills | Status: DC

## 2020-09-20 MED ORDER — ALLOPURINOL 300 MG PO TABS
300.0000 mg | ORAL_TABLET | Freq: Every day | ORAL | 1 refills | Status: DC
Start: 2020-09-20 — End: 2021-03-20

## 2020-09-20 MED ORDER — CHLORTHALIDONE 25 MG PO TABS
25.0000 mg | ORAL_TABLET | Freq: Every day | ORAL | 1 refills | Status: AC
Start: 2020-09-20 — End: ?

## 2020-09-20 NOTE — Patient Instructions (Signed)
Pressure Injury  A pressure injury is damage to the skin and underlying tissue that results from pressure being applied to an area of the body. It often affects people who must spend a long time in a bed or chair because of a medical condition. Pressure injuries usually occur:  Over bony parts of the body, such as the tailbone, shoulders, elbows, hips, heels, spine, ankles, and back of the head.  Under medical devices that make contact with the body, such as respiratory equipment, stockings, tubes, and splints. Pressure injuries start as reddened areas on the skin and can lead to pain and an open wound. What are the causes? This condition is caused by frequent or constant pressure to an area of the body. Decreased blood flow to the skin can eventually cause the skin tissue to die and break down, causing a wound. What increases the risk? You are more likely to develop this condition if you:  Are in the hospital or an extended care facility.  Are bedridden or in a wheelchair.  Have an injury or disease that keeps you from: ? Moving normally. ? Feeling pain or pressure.  Have a condition that: ? Makes you sleepy or less alert. ? Causes poor blood flow.  Need to wear a medical device.  Have poor control of your bladder or bowel functions (incontinence).  Have poor nutrition (malnutrition). If you are at risk for pressure injuries, your health care provider may recommend certain types of mattresses, mattress covers, pillows, cushions, or boots to help prevent them. These may include products filled with air, foam, gel, or sand. What are the signs or symptoms? Symptoms of this condition depend on the severity of the injury. Symptoms may include:  Red or dark areas of the skin.  Pain, warmth, or a change of skin texture.  Blisters.  An open wound. How is this diagnosed? This condition is diagnosed with a medical history and physical exam. You may also have tests, such as:  Blood  tests.  Imaging tests.  Blood flow tests. Your pressure injury will be staged based on its severity. Staging is based on:  The depth of the tissue injury, including whether there is exposure of muscle, bone, or tendon.  The cause of the pressure injury. How is this treated? This condition may be treated by:  Relieving or redistributing pressure on your skin. This includes: ? Frequently changing your position. ? Avoiding positions that caused the wound or that can make the wound worse. ? Using specific bed mattresses, chair cushions, or protective boots. ? Moving medical devices from an area of pressure, or placing padding between the skin and the device. ? Using foams, creams, or powders to prevent rubbing (friction) on the skin.  Keeping your skin clean and dry. This may include using a skin cleanser or skin barrier as told by your health care provider.  Cleaning your injury and removing any dead tissue from the wound (debridement).  Placing a bandage (dressing) over your injury.  Using medicines for pain or to prevent or treat infection. Surgery may be needed if other treatments are not working or if your injury is very deep. Follow these instructions at home: Wound care  Follow instructions from your health care provider about how to take care of your wound. Make sure you: ? Wash your hands with soap and water before and after you change your bandage (dressing). If soap and water are not available, use hand sanitizer. ? Change your dressing as told   by your health care provider.  Check your wound every day for signs of infection. Have a caregiver do this for you if you are not able. Check for: ? Redness, swelling, or increased pain. ? More fluid or blood. ? Warmth. ? Pus or a bad smell. Skin care  Keep your skin clean and dry. Gently pat your skin dry.  Do not rub or massage your skin.  You or a caregiver should check your skin every day for any changes in color or  any new blisters or sores (ulcers). Medicines  Take over-the-counter and prescription medicines only as told by your health care provider.  If you were prescribed an antibiotic medicine, take or apply it as told by your health care provider. Do not stop using the antibiotic even if your condition improves. Reducing and redistributing pressure  Do not lie or sit in one position for a long time. Move or change position every 1-2 hours, or as told by your health care provider.  Use pillows or cushions to reduce pressure. Ask your health care provider to recommend cushions or pads for you. General instructions  Eat a healthy diet that includes lots of protein.  Drink enough fluid to keep your urine pale yellow.  Be as active as you can every day. Ask your health care provider to suggest safe exercises or activities.  Do not abuse drugs or alcohol.  Do not use any products that contain nicotine or tobacco, such as cigarettes, e-cigarettes, and chewing tobacco. If you need help quitting, ask your health care provider.  Keep all follow-up visits as told by your health care provider. This is important.   Contact a health care provider if:  You have: ? A fever or chills. ? Pain that is not helped by medicine. ? Any changes in skin color. ? New blisters or sores. ? Pus or a bad smell coming from your wound. ? Redness, swelling, or pain around your wound. ? More fluid or blood coming from your wound.  Your wound does not improve after 1-2 weeks of treatment. Summary  A pressure injury is damage to the skin and underlying tissue that results from pressure being applied to an area of the body.  Do not lie or sit in one position for a long time. Your health care provider may advise you to move or change position every 1-2 hours.  Follow instructions from your health care provider about how to take care of your wound.  Keep all follow-up visits as told by your health care provider. This  is important. This information is not intended to replace advice given to you by your health care provider. Make sure you discuss any questions you have with your health care provider. Document Revised: 11/18/2017 Document Reviewed: 11/18/2017 Elsevier Patient Education  2021 [place] Prevention in the Home, Adult Falls can cause injuries and can happen to people of all ages. There are many things you can do to make your home safe and to help prevent falls. Ask for help when making these changes. What actions can I take to prevent falls? General Instructions  Use good lighting in all rooms. Replace any light bulbs that burn out.  Turn on the lights in dark areas. Use night-lights.  Keep items that you use often in easy-to-reach places. Lower the shelves around your home if needed.  Set up your furniture so you have a clear path. Avoid moving your furniture around.  Do not have throw rugs or  other things on the floor that can make you trip.  Avoid walking on wet floors.  If any of your floors are uneven, fix them.  Add color or contrast paint or tape to clearly mark and help you see: ? Grab bars or handrails. ? First and last steps of staircases. ? Where the edge of each step is.  If you use a stepladder: ? Make sure that it is fully opened. Do not climb a closed stepladder. ? Make sure the sides of the stepladder are locked in place. ? Ask someone to hold the stepladder while you use it.  Know where your pets are when moving through your home. What can I do in the bathroom?  Keep the floor dry. Clean up any water on the floor right away.  Remove soap buildup in the tub or shower.  Use nonskid mats or decals on the floor of the tub or shower.  Attach bath mats securely with double-sided, nonslip rug tape.  If you need to sit down in the shower, use a plastic, nonslip stool.  Install grab bars by the toilet and in the tub and shower. Do not use towel bars as  grab bars.      What can I do in the bedroom?  Make sure that you have a light by your bed that is easy to reach.  Do not use any sheets or blankets for your bed that hang to the floor.  Have a firm chair with side arms that you can use for support when you get dressed. What can I do in the kitchen?  Clean up any spills right away.  If you need to reach something above you, use a step stool with a grab bar.  Keep electrical cords out of the way.  Do not use floor polish or wax that makes floors slippery. What can I do with my stairs?  Do not leave any items on the stairs.  Make sure that you have a light switch at the top and the bottom of the stairs.  Make sure that there are handrails on both sides of the stairs. Fix handrails that are broken or loose.  Install nonslip stair treads on all your stairs.  Avoid having throw rugs at the top or bottom of the stairs.  Choose a carpet that does not hide the edge of the steps on the stairs.  Check carpeting to make sure that it is firmly attached to the stairs. Fix carpet that is loose or worn. What can I do on the outside of my home?  Use bright outdoor lighting.  Fix the edges of walkways and driveways and fix any cracks.  Remove anything that might make you trip as you walk through a door, such as a raised step or threshold.  Trim any bushes or trees on paths to your home.  Check to see if handrails are loose or broken and that both sides of all steps have handrails.  Install guardrails along the edges of any raised decks and porches.  Clear paths of anything that can make you trip, such as tools or rocks.  Have leaves, snow, or ice cleared regularly.  Use sand or salt on paths during winter.  Clean up any spills in your garage right away. This includes grease or oil spills. What other actions can I take?  Wear shoes that: ? Have a low heel. Do not wear high heels. ? Have rubber bottoms. ? Feel good on your  feet and fit well. ? Are closed at the toe. Do not wear open-toe sandals.  Use tools that help you move around if needed. These include: ? Canes. ? Walkers. ? Scooters. ? Crutches.  Review your medicines with your doctor. Some medicines can make you feel dizzy. This can increase your chance of falling. Ask your doctor what else you can do to help prevent falls. Where to find more information  Centers for Disease Control and Prevention, STEADI: http://www.wolf.info/  National Institute on Aging: http://kim-miller.com/ Contact a doctor if:  You are afraid of falling at home.  You feel weak, drowsy, or dizzy at home.  You fall at home. Summary  There are many simple things that you can do to make your home safe and to help prevent falls.  Ways to make your home safe include removing things that can make you trip and installing grab bars in the bathroom.  Ask for help when making these changes in your home. This information is not intended to replace advice given to you by your health care provider. Make sure you discuss any questions you have with your health care provider. Document Revised: 11/23/2019 Document Reviewed: 11/23/2019 Elsevier Patient Education  [place].

## 2020-09-21 ENCOUNTER — Emergency Department (HOSPITAL_COMMUNITY)
Admission: EM | Admit: 2020-09-21 | Discharge: 2020-09-22 | Disposition: A | Discharge status: Home / Self Care | Payer: Medicare Other | Attending: Emergency Medicine | Admitting: Emergency Medicine

## 2020-09-21 DIAGNOSIS — E1136 Type 2 diabetes mellitus with diabetic cataract: Secondary | ICD-10-CM | POA: Diagnosis not present

## 2020-09-21 DIAGNOSIS — Z79899 Other long term (current) drug therapy: Secondary | ICD-10-CM | POA: Insufficient documentation

## 2020-09-21 DIAGNOSIS — N182 Chronic kidney disease, stage 2 (mild): Secondary | ICD-10-CM | POA: Diagnosis not present

## 2020-09-21 DIAGNOSIS — I130 Hypertensive heart and chronic kidney disease with heart failure and stage 1 through stage 4 chronic kidney disease, or unspecified chronic kidney disease: Secondary | ICD-10-CM | POA: Diagnosis not present

## 2020-09-21 DIAGNOSIS — E785 Hyperlipidemia, unspecified: Secondary | ICD-10-CM | POA: Diagnosis not present

## 2020-09-21 DIAGNOSIS — E1169 Type 2 diabetes mellitus with other specified complication: Secondary | ICD-10-CM | POA: Insufficient documentation

## 2020-09-21 DIAGNOSIS — L89302 Pressure ulcer of unspecified buttock, stage 2: Secondary | ICD-10-CM | POA: Diagnosis not present

## 2020-09-21 DIAGNOSIS — I5031 Acute diastolic (congestive) heart failure: Secondary | ICD-10-CM | POA: Insufficient documentation

## 2020-09-21 DIAGNOSIS — Z794 Long term (current) use of insulin: Secondary | ICD-10-CM | POA: Insufficient documentation

## 2020-09-21 DIAGNOSIS — E1122 Type 2 diabetes mellitus with diabetic chronic kidney disease: Secondary | ICD-10-CM | POA: Diagnosis not present

## 2020-09-21 DIAGNOSIS — M79659 Pain in unspecified thigh: Secondary | ICD-10-CM | POA: Diagnosis present

## 2020-09-21 DIAGNOSIS — Z7982 Long term (current) use of aspirin: Secondary | ICD-10-CM | POA: Diagnosis not present

## 2020-09-21 DIAGNOSIS — Z951 Presence of aortocoronary bypass graft: Secondary | ICD-10-CM | POA: Insufficient documentation

## 2020-09-21 NOTE — ED Triage Notes (Addendum)
Pt brought by EMS for bed sore to buttocks. Pt was seen by PCP yesterday and pt says he is in pain, states, his "doctor doesn't give him enough pain medication". Pt says he thinks he is out of his hydrocodone, but not sure. Pt says he took a pain pill a couple of hours ago.  EMS says pt spouse requested that pt "be admitted for wound care".

## 2020-09-21 NOTE — Progress Notes (Signed)
Established Patient Office Visit  Subjective:  Patient ID: Kevin Daniel, male    DOB: [birth year]  Age: 75 y.o. MRN: [id number]  CC:  Chief Complaint  Patient presents with  . Pressure Sores    On buttocks  . Fall    Patient fell in parking lot, scraped up left arm and hand   . Medication Refill    Allopurinol, Chlorthalidone, Freestyle Sensor, Tramadol     HPI Kevin Daniel presents today with concerns of pressure ulcers on his buttocks that have worsened over the last month. Patient now has painful ulcers that are red and draining. He has been sleeping in a recliner and not able to move around as much. Wife reports his nutrition is not the best. She cooks but he does not eat well. He has been under the care of [facility] in the past when he had pressure ulcers before.   Upon arrival, patient sustained a fall on the sidewalk outside of the building. He has a skin tear on the left hand and arm. He denies any other injuries. No head injuries.   Past Medical History:  Diagnosis Date  . Anxiety   . Arthritis   . Atrial fibrillation ([place])   . Cataract   . Depression   . Diabetes mellitus without complication ([place])   . Hyperlipidemia   . Hypertension   . Osteoporosis   . Sleep apnea    no CPAP, sleeps in recliner  . Stroke [facility])    2010- ? TIA    Past Surgical History:  Procedure Laterality Date  . ANKLE FRACTURE SURGERY Left 2020  . APPENDECTOMY  1964  . COLONOSCOPY    . CORONARY ARTERY BYPASS GRAFT  2010   at time of COX-MAZE procedure  . COX-MAZE MICROWAVE ABLATION  2010   for AFib- done at time of Bypass- made scar tissue  . TOE AMPUTATION Left     Family History  Problem Relation Age of Onset  . Arthritis Mother   . COPD Mother   . Early death Mother   . Stroke Father   . Colon cancer Father   . Prostate cancer Father   . Rectal cancer Father   . Arthritis Brother   . Stroke Brother   . Esophageal cancer Neg Hx   . Pancreatic cancer Neg  Hx   . Stomach cancer Neg Hx   . Liver disease Neg Hx     Social History   Socioeconomic History  . Marital status: Married    Spouse name: [place]  . Number of children: Not on file  . Years of education: Not on file  . Highest education level: Not on file  Occupational History  . Occupation: retired  Tobacco Use  . Smoking status: Never Smoker  . Smokeless tobacco: Never Used  Vaping Use  . Vaping Use: Never used  Substance and Sexual Activity  . Alcohol use: Yes    Comment: occasionally  . Drug use: Not Currently  . Sexual activity: Not Currently  Other Topics Concern  . Not on file  Social History Narrative  . Not on file   Social Determinants of Health   Financial Resource Strain: Low Risk   . Difficulty of Paying Living Expenses: Not hard at all  Food Insecurity: No Food Insecurity  . Worried About Charity fundraiser in the Last Year: Never true  . Ran Out of Food in the Last Year: Never true  Transportation Needs: No Transportation  Needs  . Lack of Transportation (Medical): No  . Lack of Transportation (Non-Medical): No  Physical Activity: Inactive  . Days of Exercise per Week: 0 days  . Minutes of Exercise per Session: 0 min  Stress: No Stress Concern Present  . Feeling of Stress : Not at all  Social Connections: Moderately Isolated  . Frequency of Communication with Friends and Family: More than three times a week  . Frequency of Social Gatherings with Friends and Family: Once a week  . Attends Religious Services: Never  . Active Member of Clubs or Organizations: No  . Attends Archivist Meetings: Never  . Marital Status: Married  Human resources officer Violence: Not At Risk  . Fear of Current or Ex-Partner: No  . Emotionally Abused: No  . Physically Abused: No  . Sexually Abused: No    Outpatient Medications Prior to Visit  Medication Sig Dispense Refill  . Continuous Blood Gluc Receiver (FREESTYLE LIBRE 14 DAY READER) DEVI Apply reader for  monitoring of continuous glucose monitoring 1 each 11  . escitalopram (LEXAPRO) 20 MG tablet Take 1 tablet (20 mg total) by mouth daily. 90 tablet 1  . insulin NPH Human (NOVOLIN N) 100 UNIT/ML injection Inject 35 Units into the skin daily as needed (for sugar).    . insulin regular (NOVOLIN R) 100 units/mL injection Inject into the skin 3 (three) times daily as needed for high blood sugar.    . metoprolol tartrate (LOPRESSOR) 50 MG tablet TAKE 1&1/2 TABLETS BY MOUTH TWICE A DAY (Patient taking differently: Take 50 mg by mouth 2 (two) times daily.) 270 tablet 1  . traZODone (DESYREL) 100 MG tablet TAKE 1 TABLET BY MOUTH EVERYDAY AT BEDTIME 90 tablet 1  . allopurinol (ZYLOPRIM) 300 MG tablet TAKE 1 TABLET BY MOUTH EVERY DAY 90 tablet 0  . chlorthalidone (HYGROTON) 25 MG tablet TAKE 1 TABLET BY MOUTH EVERY DAY 90 tablet 1  . Continuous Blood Gluc Sensor (FREESTYLE LIBRE 14 DAY SENSOR) MISC APPLY EVERY 14 DAY TO CHECK BLOOD SUGAR 1 each 11  . traMADol (ULTRAM) 50 MG tablet Take 1 tablet (50 mg total) by mouth every 6 (six) hours as needed for moderate pain. 10 tablet 0  . aspirin EC 81 MG EC tablet Take 1 tablet (81 mg total) by mouth daily. Swallow whole. (Patient not taking: Reported on 09/20/2020) 30 tablet 11  . Blood Pressure Monitoring (SPHYGMOMANOMETER) MISC 1 Device by Does not apply route 1 day or 1 dose for 1 dose. 1 each 0  . furosemide (LASIX) 20 MG tablet Take 1 tablet (20 mg total) by mouth daily. (Patient not taking: Reported on 09/20/2020) 30 tablet 1  . losartan (COZAAR) 50 MG tablet Take 1 tablet (50 mg total) by mouth daily. 30 tablet 0  . potassium chloride (KLOR-CON) 10 MEQ tablet Take 1 tablet (10 mEq total) by mouth daily. (Patient not taking: Reported on 09/20/2020) 30 tablet 0   No facility-administered medications prior to visit.    Allergies  Allergen Reactions  . Lisinopril Swelling and Other (See Comments)    angioedema    ROS Review of Systems    Objective:     Physical Exam  BP 120/84   Pulse 64   Temp 97.7 F (36.5 C) (Temporal)   Wt 208 lb (94.3 kg)   SpO2 95%   BMI 30.72 kg/m  Wt Readings from Last 3 Encounters:  09/20/20 208 lb (94.3 kg)  06/13/20 212 lb (96.2 kg)  05/15/20 208  lb (94.3 kg)     Health Maintenance Due  Topic Date Due  . OPHTHALMOLOGY EXAM  Never done  . URINE MICROALBUMIN  Never done    There are no preventive care reminders to display for this patient.  Lab Results  Component Value Date   TSH 2.370 04/18/2020   Lab Results  Component Value Date   WBC 8.2 05/15/2020   HGB 13.5 05/15/2020   HCT 41.0 05/15/2020   MCV 99.8 05/15/2020   PLT 203.0 05/15/2020   Lab Results  Component Value Date   NA 135 05/15/2020   K 3.5 05/15/2020   CO2 30 05/15/2020   GLUCOSE 139 (H) 05/15/2020   BUN 12 05/15/2020   CREATININE 0.64 05/15/2020   BILITOT 0.9 05/15/2020   ALKPHOS 90 05/15/2020   AST 15 05/15/2020   ALT 14 05/15/2020   PROT 7.5 05/15/2020   ALBUMIN 3.6 05/15/2020   CALCIUM 9.2 05/15/2020   ANIONGAP 9 04/19/2020   GFR 93.57 05/15/2020   Lab Results  Component Value Date   CHOL 209 (H) 02/27/2020   Lab Results  Component Value Date   HDL 128.30 02/27/2020   Lab Results  Component Value Date   LDLCALC 57 02/27/2020   Lab Results  Component Value Date   TRIG 116.0 02/27/2020   Lab Results  Component Value Date   CHOLHDL 2 02/27/2020   Lab Results  Component Value Date   HGBA1C 6.7 (H) 04/18/2020      Assessment & Plan:   Problem List Items Addressed This Visit    Type 2 diabetes mellitus with stage 2 chronic kidney disease, with long-term current use of insulin ([place])   Relevant Medications   Continuous Blood Gluc Sensor (FREESTYLE LIBRE 14 DAY SENSOR) MISC   Other Relevant Orders   Ambulatory referral to [place] injury of skin of sacral region   Relevant Orders   Ambulatory referral to [place] culture (Completed)   Pressure injury of skin -  Primary   Relevant Orders   Ambulatory referral to [place] culture (Completed)   Ambulatory referral to [place] culture (Completed)   Ambulatory referral to [place] (Completed)   Fall    Other Visit Diagnoses    Type 2 diabetes mellitus with other circulatory complications ([place])       Relevant Medications   chlorthalidone (HYGROTON) 25 MG tablet   Other Relevant Orders   Ambulatory referral to [place] ordered this encounter  Medications  . cephALEXin (KEFLEX) 500 MG capsule    Sig: Take 1 capsule (500 mg total) by mouth 3 (three) times daily.    Dispense:  21 capsule    Refill:  0  . allopurinol (ZYLOPRIM) 300 MG tablet    Sig: Take 1 tablet (300 mg total) by mouth daily.    Dispense:  90 tablet    Refill:  1  . chlorthalidone (HYGROTON) 25 MG tablet    Sig: Take 1 tablet (25 mg total) by mouth daily.    Dispense:  90 tablet    Refill:  1  . Continuous Blood Gluc Sensor (FREESTYLE LIBRE 14 DAY SENSOR) MISC    Sig: APPLY EVERY 14 DAY TO CHECK BLOOD SUGAR    Dispense:  1 each    Refill:  11  . HYDROcodone-acetaminophen (NORCO) 10-325 MG tablet    Sig: Take 1  tablet by mouth every 8 (eight) hours as needed for up to 5 days.    Dispense:  15 tablet    Refill:  0    Follow-up: See home health for wound assessment and dressing changes. Pain medication increased temporarily to Hydrocodone. Warmed wife of increased potential for a falls. She verbalizes understanding and accept liability. Antibiotics prescribed to decrease risk of infection    Kennyth Arnold, FNP

## 2020-09-21 NOTE — Telephone Encounter (Signed)
Patient had a appt 05/19

## 2020-09-22 ENCOUNTER — Encounter (HOSPITAL_COMMUNITY): Payer: Self-pay

## 2020-09-22 ENCOUNTER — Other Ambulatory Visit: Payer: Self-pay

## 2020-09-22 DIAGNOSIS — L89302 Pressure ulcer of unspecified buttock, stage 2: Secondary | ICD-10-CM | POA: Diagnosis not present

## 2020-09-22 LAB — CBC WITH DIFFERENTIAL/PLATELET
Abs Immature Granulocytes: 0.07 10*3/uL (ref 0.00–0.07)
Basophils Absolute: 0.1 10*3/uL (ref 0.0–0.1)
Basophils Relative: 1 %
Eosinophils Absolute: 0.2 10*3/uL (ref 0.0–0.5)
Eosinophils Relative: 3 %
HCT: 39.1 % (ref 39.0–52.0)
Hemoglobin: 13 g/dL (ref 13.0–17.0)
Immature Granulocytes: 1 %
Lymphocytes Relative: 19 %
Lymphs Abs: 1.4 10*3/uL (ref 0.7–4.0)
MCH: 33.5 pg (ref 26.0–34.0)
MCHC: 33.2 g/dL (ref 30.0–36.0)
MCV: 100.8 fL — ABNORMAL HIGH (ref 80.0–100.0)
Monocytes Absolute: 0.7 10*3/uL (ref 0.1–1.0)
Monocytes Relative: 10 %
Neutro Abs: 4.9 10*3/uL (ref 1.7–7.7)
Neutrophils Relative %: 66 %
Platelets: 221 10*3/uL (ref 150–400)
RBC: 3.88 MIL/uL — ABNORMAL LOW (ref 4.22–5.81)
RDW: 14.7 % (ref 11.5–15.5)
WBC: 7.3 10*3/uL (ref 4.0–10.5)
nRBC: 0 % (ref 0.0–0.2)

## 2020-09-22 LAB — BASIC METABOLIC PANEL
Anion gap: 12 (ref 5–15)
BUN: 36 mg/dL — ABNORMAL HIGH (ref 8–23)
CO2: 28 mmol/L (ref 22–32)
Calcium: 9.2 mg/dL (ref 8.9–10.3)
Chloride: 95 mmol/L — ABNORMAL LOW (ref 98–111)
Creatinine, Ser: 0.84 mg/dL (ref 0.61–1.24)
GFR, Estimated: >60 mL/min (ref >60)
Glucose, Bld: 230 mg/dL — ABNORMAL HIGH (ref 70–99)
Potassium: 3.8 mmol/L (ref 3.5–5.1)
Sodium: 135 mmol/L (ref 135–145)

## 2020-09-22 LAB — SEDIMENTATION RATE: Sed Rate: 68 mm/hr — ABNORMAL HIGH (ref 0–16)

## 2020-09-22 MED ORDER — LIDOCAINE 4 % EX PTCH
1.0000 | MEDICATED_PATCH | Freq: Every day | CUTANEOUS | 0 refills | Status: DC
Start: 2020-09-22 — End: 2020-12-10

## 2020-09-22 NOTE — Discharge Instructions (Addendum)
Return if you start running a fever.

## 2020-09-22 NOTE — ED Notes (Signed)
Pt found sitting on the edge of his bed. When asked about what he was doing, he stated, "I'm getting out of here. I've been here all day since 0700." Pt was informed that he was brought in about 2 hours ago with EMS and that he's in the ER. Pt was repositioned back in bed and reconnected to monitor. Pt denied any other needs at this time and was informed that the provider would be in to see him shortly. Will continue to monitor.

## 2020-09-22 NOTE — ED Provider Notes (Signed)
[facility] EMERGENCY DEPARTMENT Provider Note   CSN: [id number] Arrival date & time: 09/21/20  2353     History Chief Complaint  Patient presents with  . painful wound    Kevin Daniel is a 75 y.o. male.  The history is provided by the patient.  He has history of hypertension, diabetes, hyperlipidemia and comes in complaining of a painful bedsore.  The sore is located in his buttocks and has been present for about a month but not significantly more painful over the last week.  He did see his primary care provider yesterday who started him on cephalexin and was setting up home health services.  He was given prescription for hydrocodone-acetaminophen which she states does give some temporary relief of pain.  Pain is currently rated at 9/10.  He denies fever chills.  He denies hurting anywhere else.   Past Medical History:  Diagnosis Date  . Anxiety   . Arthritis   . Atrial fibrillation ([place])   . Cataract   . Depression   . Diabetes mellitus without complication ([place])   . Hyperlipidemia   . Hypertension   . Osteoporosis   . Sleep apnea    no CPAP, sleeps in recliner  . Stroke [facility])    2010- ? TIA    Patient Active Problem List   Diagnosis Date Noted  . Diarrhea 06/13/2020  . Gait instability   . Acute diastolic CHF (congestive heart failure) ([place]) 04/18/2020  . Fall 04/16/2020  . Unspecified atrial fibrillation ([place]) 04/16/2020  . Pressure injury of skin 04/16/2020  . Pressure injury of skin of sacral region 10/03/2019  . Chronic diarrhea 10/03/2019  . Chronic fatigue 10/03/2019  . Type 2 diabetes mellitus with stage 2 chronic kidney disease, with long-term current use of insulin ([place]) 10/03/2019  . Hypertension associated with diabetes ([place]) 10/03/2019  . Hyperlipidemia associated with type 2 diabetes mellitus ([place]) 10/03/2019    Past Surgical History:  Procedure Laterality Date  . ANKLE FRACTURE SURGERY Left 2020  . APPENDECTOMY  1964  . COLONOSCOPY     . CORONARY ARTERY BYPASS GRAFT  2010   at time of COX-MAZE procedure  . COX-MAZE MICROWAVE ABLATION  2010   for AFib- done at time of Bypass- made scar tissue  . TOE AMPUTATION Left        Family History  Problem Relation Age of Onset  . Arthritis Mother   . COPD Mother   . Early death Mother   . Stroke Father   . Colon cancer Father   . Prostate cancer Father   . Rectal cancer Father   . Arthritis Brother   . Stroke Brother   . Esophageal cancer Neg Hx   . Pancreatic cancer Neg Hx   . Stomach cancer Neg Hx   . Liver disease Neg Hx     Social History   Tobacco Use  . Smoking status: Never Smoker  . Smokeless tobacco: Never Used  Vaping Use  . Vaping Use: Never used  Substance Use Topics  . Alcohol use: Yes    Comment: occasionally  . Drug use: Not Currently    Home Medications Prior to Admission medications   Medication Sig Start Date End Date Taking? Authorizing Provider  allopurinol (ZYLOPRIM) 300 MG tablet Take 1 tablet (300 mg total) by mouth daily. 09/20/20   Dutch Quint B, FNP  aspirin EC 81 MG EC tablet Take 1 tablet (81 mg total) by mouth daily. Swallow whole. Patient not taking:  Reported on 09/20/2020 04/19/20   Orson Eva, MD  Blood Pressure Monitoring [facility]) MISC 1 Device by Does not apply route 1 day or 1 dose for 1 dose. 06/28/20 06/29/20  Allwardt, Randa Evens, PA-C  cephALEXin (KEFLEX) 500 MG capsule Take 1 capsule (500 mg total) by mouth 3 (three) times daily. 09/20/20   Kennyth Arnold, FNP  chlorthalidone (HYGROTON) 25 MG tablet Take 1 tablet (25 mg total) by mouth daily. 09/20/20   Kennyth Arnold, FNP  Continuous Blood Gluc Receiver (FREESTYLE LIBRE 14 DAY READER) [place] Apply reader for monitoring of continuous glucose monitoring 09/30/19   Brunetta Jeans, PA-C  Continuous Blood Gluc Sensor (FREESTYLE LIBRE 14 DAY SENSOR) MISC APPLY EVERY 14 DAY TO CHECK BLOOD SUGAR 09/20/20   Dutch Quint B, FNP  escitalopram (LEXAPRO) 20 MG tablet Take 1  tablet (20 mg total) by mouth daily. 04/02/20   Brunetta Jeans, PA-C  furosemide (LASIX) 20 MG tablet Take 1 tablet (20 mg total) by mouth daily. Patient not taking: Reported on 09/20/2020 06/28/20   Allwardt, Randa Evens, PA-C  HYDROcodone-acetaminophen (NORCO) 10-325 MG tablet Take 1 tablet by mouth every 8 (eight) hours as needed for up to 5 days. 09/20/20 09/25/20  Dutch Quint B, FNP  insulin NPH Human (NOVOLIN N) 100 UNIT/ML injection Inject 35 Units into the skin daily as needed (for sugar).    [provider]  insulin regular (NOVOLIN R) 100 units/mL injection Inject into the skin 3 (three) times daily as needed for high blood sugar.    [provider]  losartan (COZAAR) 50 MG tablet Take 1 tablet (50 mg total) by mouth daily. 07/04/20 08/03/20  Allwardt, Randa Evens, PA-C  metoprolol tartrate (LOPRESSOR) 50 MG tablet TAKE 1&1/2 TABLETS BY MOUTH TWICE A DAY Patient taking differently: Take 50 mg by mouth 2 (two) times daily. 06/04/20   Midge Minium, MD  potassium chloride (KLOR-CON) 10 MEQ tablet Take 1 tablet (10 mEq total) by mouth daily. Patient not taking: Reported on 09/20/2020 06/28/20   Allwardt, Randa Evens, PA-C  traZODone (DESYREL) 100 MG tablet TAKE 1 TABLET BY MOUTH EVERYDAY AT BEDTIME 06/04/20   Midge Minium, MD    Allergies    Lisinopril  Review of Systems   Review of Systems  All other systems reviewed and are negative.   Physical Exam Updated Vital Signs BP 116/67 (BP Location: Left Arm)   Pulse 66   Temp 98.1 F (36.7 C) (Oral)   Resp 18   Ht 5\' 9"  (1.753 m)   Wt 94.3 kg   SpO2 97%   BMI 30.70 kg/m   Physical Exam Vitals and nursing note reviewed.   75 year old male, resting comfortably and in no acute distress. Vital signs are normal. Oxygen saturation is 97%, which is normal. Head is normocephalic and atraumatic. PERRLA, EOMI. Oropharynx is clear. Neck is nontender and supple without adenopathy or JVD. Back is nontender and there is  no CVA tenderness. Lungs are clear without rales, wheezes, or rhonchi. Chest is nontender. Heart has regular rate and rhythm without murmur. Abdomen is soft, flat, nontender without masses or hepatosplenomegaly and peristalsis is normoactive. Extremities have 1+ edema, full range of motion is present. Skin: Stage II decubitus present in the gluteal areas bilaterally, but worse on the left. Neurologic: Mental status is normal, cranial nerves are intact, there are no motor or sensory deficits.   ED Results / Procedures / Treatments   Labs (all labs ordered  are listed, but only abnormal results are displayed) Labs Reviewed  BASIC METABOLIC PANEL - Abnormal; Notable for the following components:      Result Value   Chloride 95 (*)    Glucose, Bld 230 (*)    BUN 36 (*)    All other components within normal limits  CBC WITH DIFFERENTIAL/PLATELET - Abnormal; Notable for the following components:   RBC 3.88 (*)    MCV 100.8 (*)    All other components within normal limits  SEDIMENTATION RATE - Abnormal; Notable for the following components:   Sed Rate 68 (*)    All other components within normal limits    Procedures Procedures   Medications Ordered in ED Medications - No data to display  ED Course  I have reviewed the triage vital signs and the nursing notes.  Pertinent lab results that were available during my care of the patient were reviewed by me and considered in my medical decision making (see chart for details).   MDM Rules/Calculators/A&P                         Painful gluteal decubiti.  Old records reviewed confirming office visit on 5/19 for same condition and referral given to home health.  At this point, it seems that appropriate measures have been instituted.  We will check screening labs.  He may benefit from referral to wound care clinic.  Labs do show an elevated BUN with normal creatinine suggesting he may be slightly dehydrated.  WBC is normal with normal  differential.  Sedimentation rate is moderately elevated at 68.  He is already on a course of cephalexin and I feel he should continue this until it is completed.  No indication for hospital admission.  However, I do think he would benefit from evaluation by wound care clinic and he is given a referral there.  Patient is requesting something topical for pain, he is given a prescription for lidocaine patch.  Advised to return if he develops a fever.  Final Clinical Impression(s) / ED Diagnoses Final diagnoses:  Pressure injury of buttock, stage 2, unspecified laterality ([place])    Rx / DC Orders ED Discharge Orders         Ordered    Lidocaine (HM LIDOCAINE PATCH) 4 % [facility]  Daily        09/22/20 0434    AMB referral to wound care center        09/22/20 4481           Delora Fuel, MD 85/63/14 [phone]

## 2020-09-23 LAB — WOUND CULTURE
MICRO NUMBER:: 11911299
RESULT:: NORMAL
SPECIMEN QUALITY:: ADEQUATE

## 2020-10-03 ENCOUNTER — Other Ambulatory Visit: Payer: Self-pay

## 2020-10-03 DIAGNOSIS — I152 Hypertension secondary to endocrine disorders: Secondary | ICD-10-CM

## 2020-10-03 DIAGNOSIS — E1159 Type 2 diabetes mellitus with other circulatory complications: Secondary | ICD-10-CM

## 2020-10-03 MED ORDER — LOSARTAN POTASSIUM 50 MG PO TABS: 50.0000 mg | ORAL_TABLET | Freq: Every day | ORAL | 0 refills | Status: AC

## 2020-11-19 ENCOUNTER — Encounter: Payer: Self-pay | Admitting: Physician Assistant

## 2020-11-19 NOTE — Telephone Encounter (Signed)
Pt requesting refills for Trazodone, Lexapro and Metoprolol. Please clarify dose and directions for Metoprolol?

## 2020-11-20 MED ORDER — METOPROLOL TARTRATE 50 MG PO TABS: 50.0000 mg | ORAL_TABLET | Freq: Two times a day (BID) | ORAL | 0 refills | Status: AC

## 2020-11-20 MED ORDER — TRAZODONE HCL 100 MG PO TABS: ORAL_TABLET | ORAL | 1 refills | Status: AC

## 2020-11-20 MED ORDER — ESCITALOPRAM OXALATE 20 MG PO TABS: 20.0000 mg | ORAL_TABLET | Freq: Every day | ORAL | 1 refills | Status: AC

## 2020-12-03 ENCOUNTER — Other Ambulatory Visit: Payer: Self-pay | Admitting: Family Medicine

## 2020-12-10 ENCOUNTER — Other Ambulatory Visit: Payer: Self-pay | Admitting: Physician Assistant

## 2020-12-10 ENCOUNTER — Telehealth (INDEPENDENT_AMBULATORY_CARE_PROVIDER_SITE_OTHER): Payer: Medicare Other | Admitting: Physician Assistant

## 2020-12-10 ENCOUNTER — Encounter: Payer: Self-pay | Admitting: Physician Assistant

## 2020-12-10 VITALS — BP 128/63 | Ht 69.0 in

## 2020-12-10 DIAGNOSIS — L89303 Pressure ulcer of unspecified buttock, stage 3: Secondary | ICD-10-CM | POA: Diagnosis not present

## 2020-12-10 DIAGNOSIS — L89322 Pressure ulcer of left buttock, stage 2: Secondary | ICD-10-CM | POA: Diagnosis not present

## 2020-12-10 DIAGNOSIS — L89159 Pressure ulcer of sacral region, unspecified stage: Secondary | ICD-10-CM

## 2020-12-10 DIAGNOSIS — R2681 Unsteadiness on feet: Secondary | ICD-10-CM

## 2020-12-10 DIAGNOSIS — R627 Adult failure to thrive: Secondary | ICD-10-CM

## 2020-12-10 NOTE — Progress Notes (Signed)
Virtual Visit via Video Note  I connected with Kevin Daniel on 12/10/20 at  4:00 PM EDT by a video enabled telemedicine application and verified that I am speaking with the correct person using two identifiers.  Location: Patient: home Provider: Therapist, music at [place] present: Patient, patient's wife Kevin Daniel, and myself   I discussed the limitations of evaluation and management by telemedicine and the availability of in person appointments. The patient expressed understanding and agreed to proceed.   History of Present Illness: 75 year old patient presents via video visit with his pleasant wife.  His wife does most of the conversation today.  He is alert and oriented, but does not have much to say.  Patient's wife says that since the beginning of this year he has been staying in his lift chair recliner.  She states that he claims he has an abundant fear of falling and one day he just sat down in the chair and has decided to not get up again.  As a result, he has had horrible pressure sores on his bottom for the last 3 months.  He has been seen in the office once and also at the emergency department.  He does have home health who comes out twice per week for wound assessment and care.  He has had referrals for physical therapy to help with strengthening his muscles, but she says that he refuses to participate.  He does have tremors in both of his hands and intermittent lapses of memory.  She says that he often asks what day it is.  He has not and who had Parkinson's disease and she says that she sees similar signs in him.  His urine output has been low and his appetite is also very decreased.  She thinks that he is all but giving up on life this year.  She does not have any additional help and as a result has injured her back as she tries to help him stand to urinate and to move and get dressed.  She is wondering if a hospital bed would be helpful at all so at least he could  roll over and take some of the pressure off his bottom, as well as help her by grabbing onto the handrails.  Wife says that he was supposed to have a referral for social worker, but she has not heard anything yet.  She says she is not sure how much longer she can do this and thinks that he really needs skilled nursing to help get these bedsores to heal and to help him get better.  Also notes that he is taking Lexapro 20 mg since they had a daughter die 2 years ago.  Observations/Objective:   Gen: Awake, alert, no acute distress, very frail & muscle wasting present Resp: Breathing is even and non-labored Psych: calm/pleasant demeanor Neuro: Alert and Oriented x 3, + facial symmetry, speech is clear.   Assessment and Plan: 1. Pressure injury of left buttock, stage 2 ([place]) 2. Pressure injury of skin of sacral region, unspecified injury stage 3. Pressure injury of buttock, stage 3, unspecified laterality ([place]) 4. Gait instability 5. Failure to thrive in adult Discussed with patient's wife that I am concerned he may have some cognitive changes or major depression or combination of both that is affecting his major declining change in lifestyle this year.  I offered a referral to psychiatry or neurology, but she says he is going to refuse as he is most often doing  lately.  I do think a Education officer, museum would be most helpful in the situation, as I am concerned about the ongoing care for him at home as he is refusing to help take care of himself and his wife is suffering injury because of it.  I am going to place an urgent referral to see if we can help get him placed into a skilled nursing facility.   Follow Up Instructions:    I discussed the assessment and treatment plan with the patient. The patient was provided an opportunity to ask questions and all were answered. The patient agreed with the plan and demonstrated an understanding of the instructions.   The patient was advised to call back or seek  an in-person evaluation if the symptoms worsen or if the condition fails to improve as anticipated.  Total time spent today with patient and wife discussing plan and options as well as documentation was 35 minutes.   Kevin Lovins M Canaan Holzer, PA-C

## 2020-12-14 ENCOUNTER — Other Ambulatory Visit: Payer: Self-pay | Admitting: Physician Assistant

## 2020-12-14 DIAGNOSIS — R2681 Unsteadiness on feet: Secondary | ICD-10-CM

## 2020-12-14 DIAGNOSIS — R627 Adult failure to thrive: Secondary | ICD-10-CM

## 2020-12-14 DIAGNOSIS — L89303 Pressure ulcer of unspecified buttock, stage 3: Secondary | ICD-10-CM

## 2020-12-14 DIAGNOSIS — L89322 Pressure ulcer of left buttock, stage 2: Secondary | ICD-10-CM

## 2020-12-14 DIAGNOSIS — L89159 Pressure ulcer of sacral region, unspecified stage: Secondary | ICD-10-CM

## 2020-12-16 ENCOUNTER — Encounter: Payer: Self-pay | Admitting: Gastroenterology

## 2020-12-20 DIAGNOSIS — Z9181 History of falling: Secondary | ICD-10-CM

## 2020-12-20 DIAGNOSIS — N189 Chronic kidney disease, unspecified: Secondary | ICD-10-CM

## 2020-12-20 DIAGNOSIS — Z794 Long term (current) use of insulin: Secondary | ICD-10-CM

## 2020-12-20 DIAGNOSIS — E1136 Type 2 diabetes mellitus with diabetic cataract: Secondary | ICD-10-CM

## 2020-12-20 DIAGNOSIS — F419 Anxiety disorder, unspecified: Secondary | ICD-10-CM

## 2020-12-20 DIAGNOSIS — I130 Hypertensive heart and chronic kidney disease with heart failure and stage 1 through stage 4 chronic kidney disease, or unspecified chronic kidney disease: Secondary | ICD-10-CM | POA: Diagnosis not present

## 2020-12-20 DIAGNOSIS — L89152 Pressure ulcer of sacral region, stage 2: Secondary | ICD-10-CM | POA: Diagnosis not present

## 2020-12-20 DIAGNOSIS — E1122 Type 2 diabetes mellitus with diabetic chronic kidney disease: Secondary | ICD-10-CM

## 2020-12-20 DIAGNOSIS — Z7984 Long term (current) use of oral hypoglycemic drugs: Secondary | ICD-10-CM

## 2020-12-20 DIAGNOSIS — I4891 Unspecified atrial fibrillation: Secondary | ICD-10-CM

## 2020-12-20 DIAGNOSIS — E1159 Type 2 diabetes mellitus with other circulatory complications: Secondary | ICD-10-CM

## 2020-12-20 DIAGNOSIS — L89312 Pressure ulcer of right buttock, stage 2: Secondary | ICD-10-CM | POA: Diagnosis not present

## 2020-12-20 DIAGNOSIS — I5030 Unspecified diastolic (congestive) heart failure: Secondary | ICD-10-CM | POA: Diagnosis not present

## 2020-12-20 DIAGNOSIS — Z4800 Encounter for change or removal of nonsurgical wound dressing: Secondary | ICD-10-CM

## 2020-12-20 DIAGNOSIS — F32A Depression, unspecified: Secondary | ICD-10-CM

## 2020-12-31 ENCOUNTER — Ambulatory Visit: Payer: Medicare Other

## 2021-01-04 ENCOUNTER — Ambulatory Visit: Payer: Medicare Other

## 2021-03-20 ENCOUNTER — Other Ambulatory Visit: Payer: Self-pay | Admitting: Family

## 2021-12-25 IMAGING — CT CT HEAD W/O CM
3 series · 16 of 47 positions shown, 19 images · non-contrast
Comparison: None.

CLINICAL DATA: Minor head trauma

EXAM:
CT HEAD WITHOUT CONTRAST
TECHNIQUE: Contiguous axial images were obtained from the base of the skull
through the vertex without intravenous contrast.

[Series 2: head w o · axial · 0.41mm/px · z∈[-131,+4]mm · 10 of 33 slices shown, 13 images]
[im 3/33  brain]
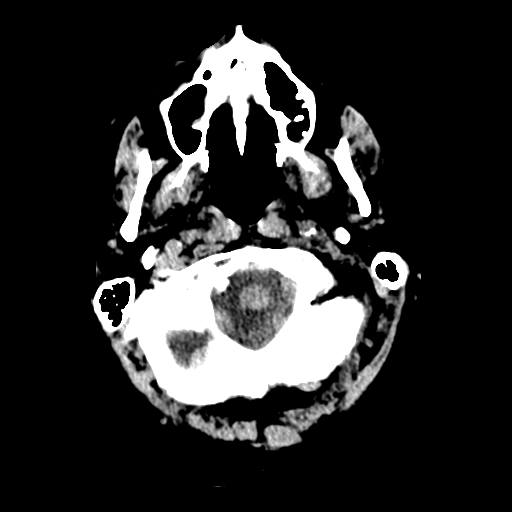
[im 3/33  bone]
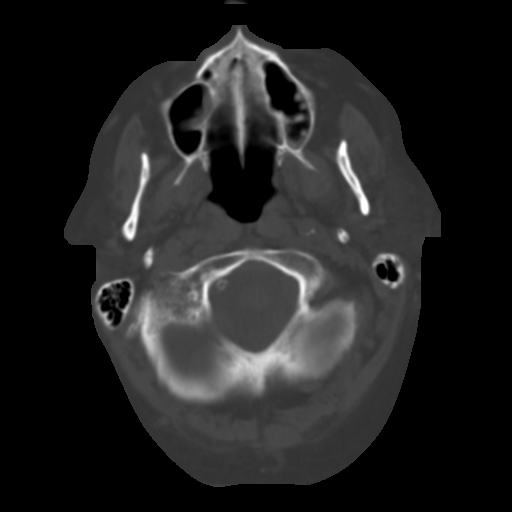
[im 6/33  brain]
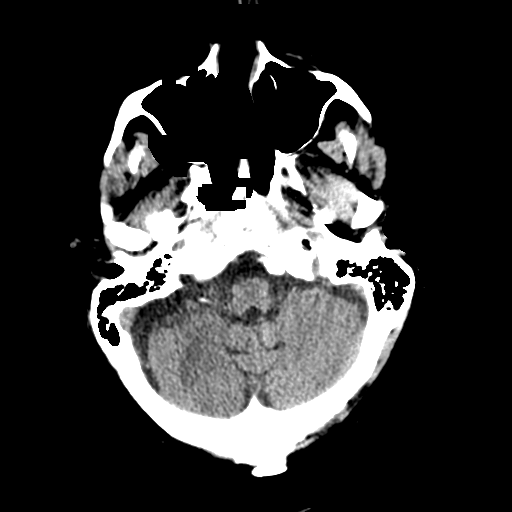
[im 9/33  brain]
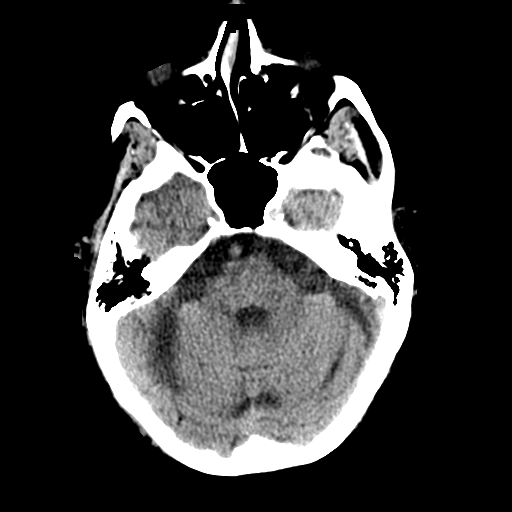
[im 12/33  brain]
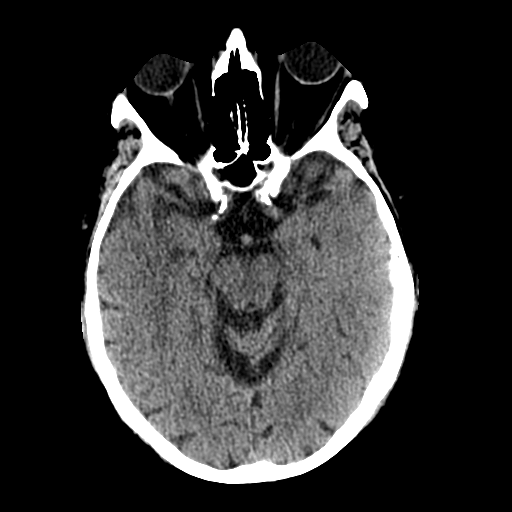
[im 15/33  brain]
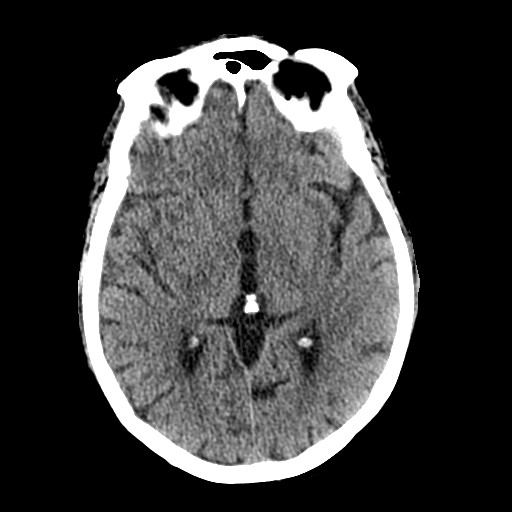
[im 15/33  bone]
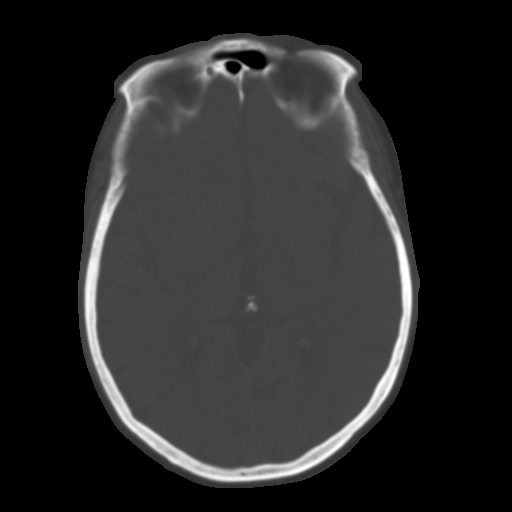
[im 18/33  brain]
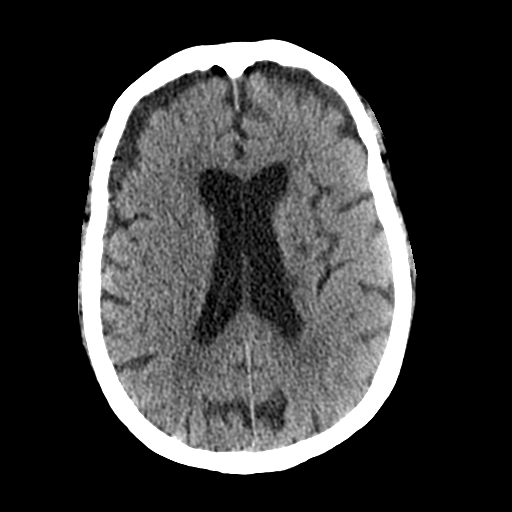
[im 21/33  brain]
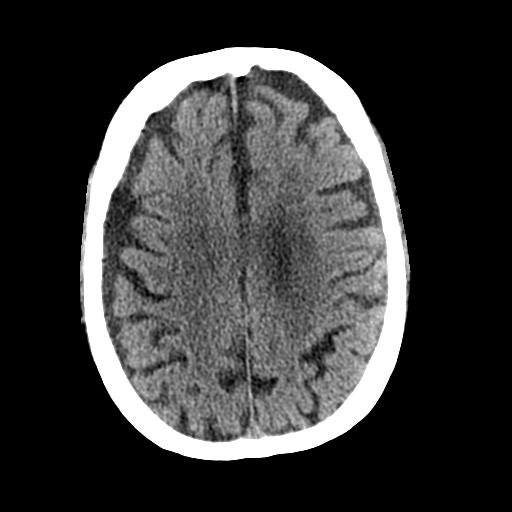
[im 25/33  brain]
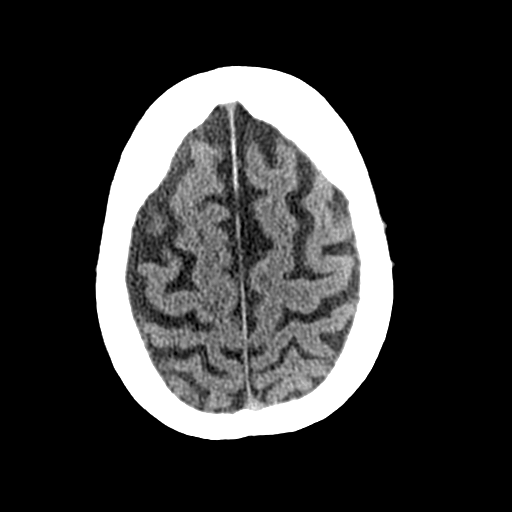
[im 27/33  brain]
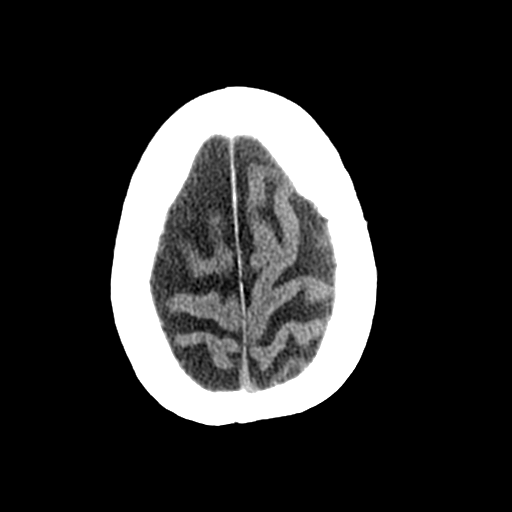
[im 27/33  bone]
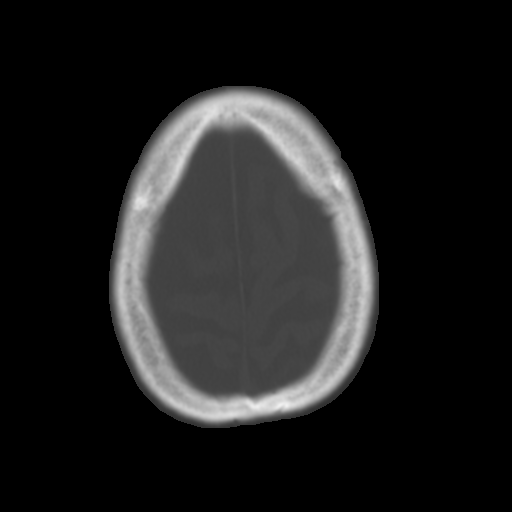
[im 30/33  brain]
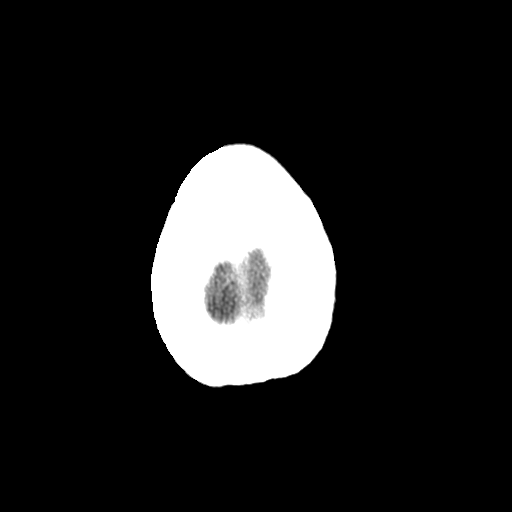

[Series 4: coronal soft · coronal · 0.36mm/px · 3 of 73 slices shown]
[im 25/73  brain]
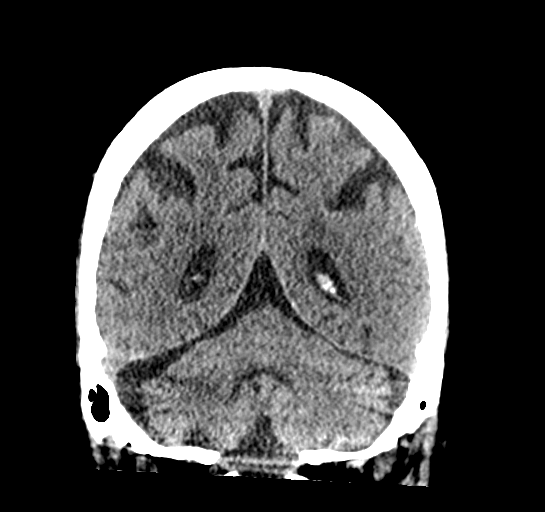
[im 33/73  brain]
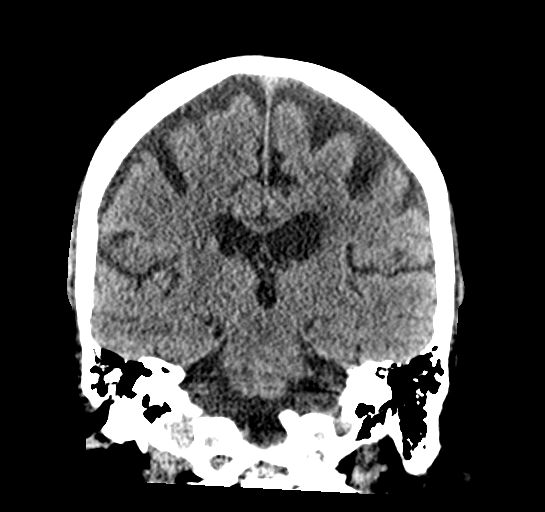
[im 41/73  brain]
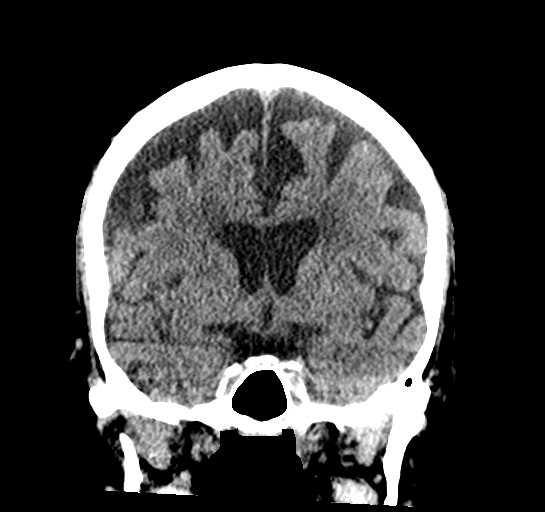

[Series 5: sagittal soft · sagittal · 0.35mm/px · 3 of 66 slices shown]
[im 22/66  brain]
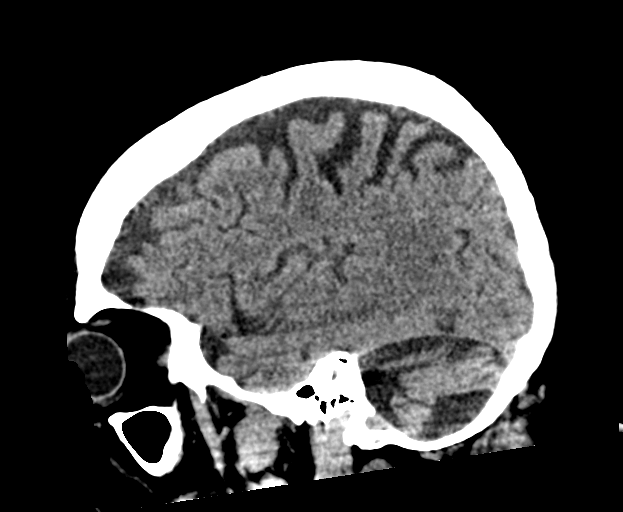
[im 33/66  brain]
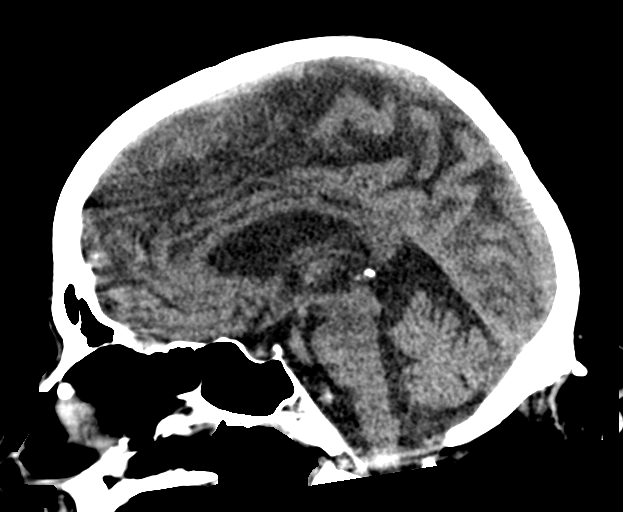
[im 44/66  brain]
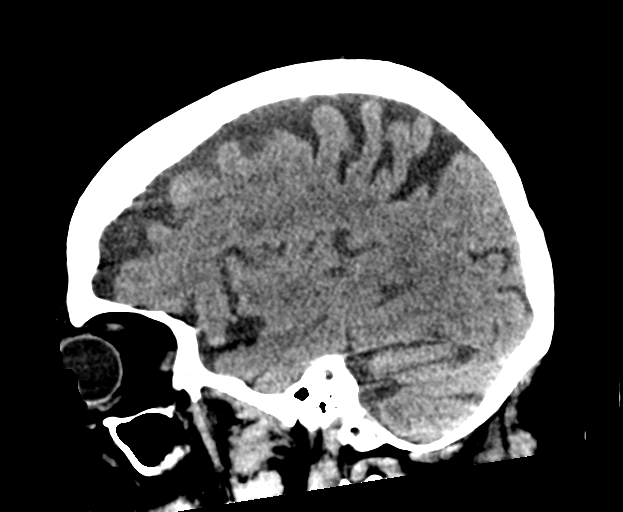

[16 of 47 positions shown; findings below may reference images not displayed]

FINDINGS: Brain: Generalized atrophy most prominent the frontal lobes.
Negative for hydrocephalus. Mild white matter changes. Chronic
infarct right cerebellum.

Negative for acute infarct, hemorrhage, mass.

Vascular: Negative for hyperdense vessel

Skull: Negative

Sinuses/Orbits: Mild mucosal edema right maxillary sinus otherwise
clear sinuses. Negative orbit.

Other: None
IMPRESSION: No acute abnormality. Generalized atrophy with mild chronic
microvascular ischemic change in the white matter. Chronic infarct
right inferior cerebellum.

## 2021-12-25 IMAGING — DX DG CHEST 1V PORT
1 series · 1 of 1 positions shown · non-contrast
Comparison: None

CLINICAL DATA: History of generalized weakness in a 73-year-old
male

EXAM:
PORTABLE CHEST 1 VIEW

[chest ap]
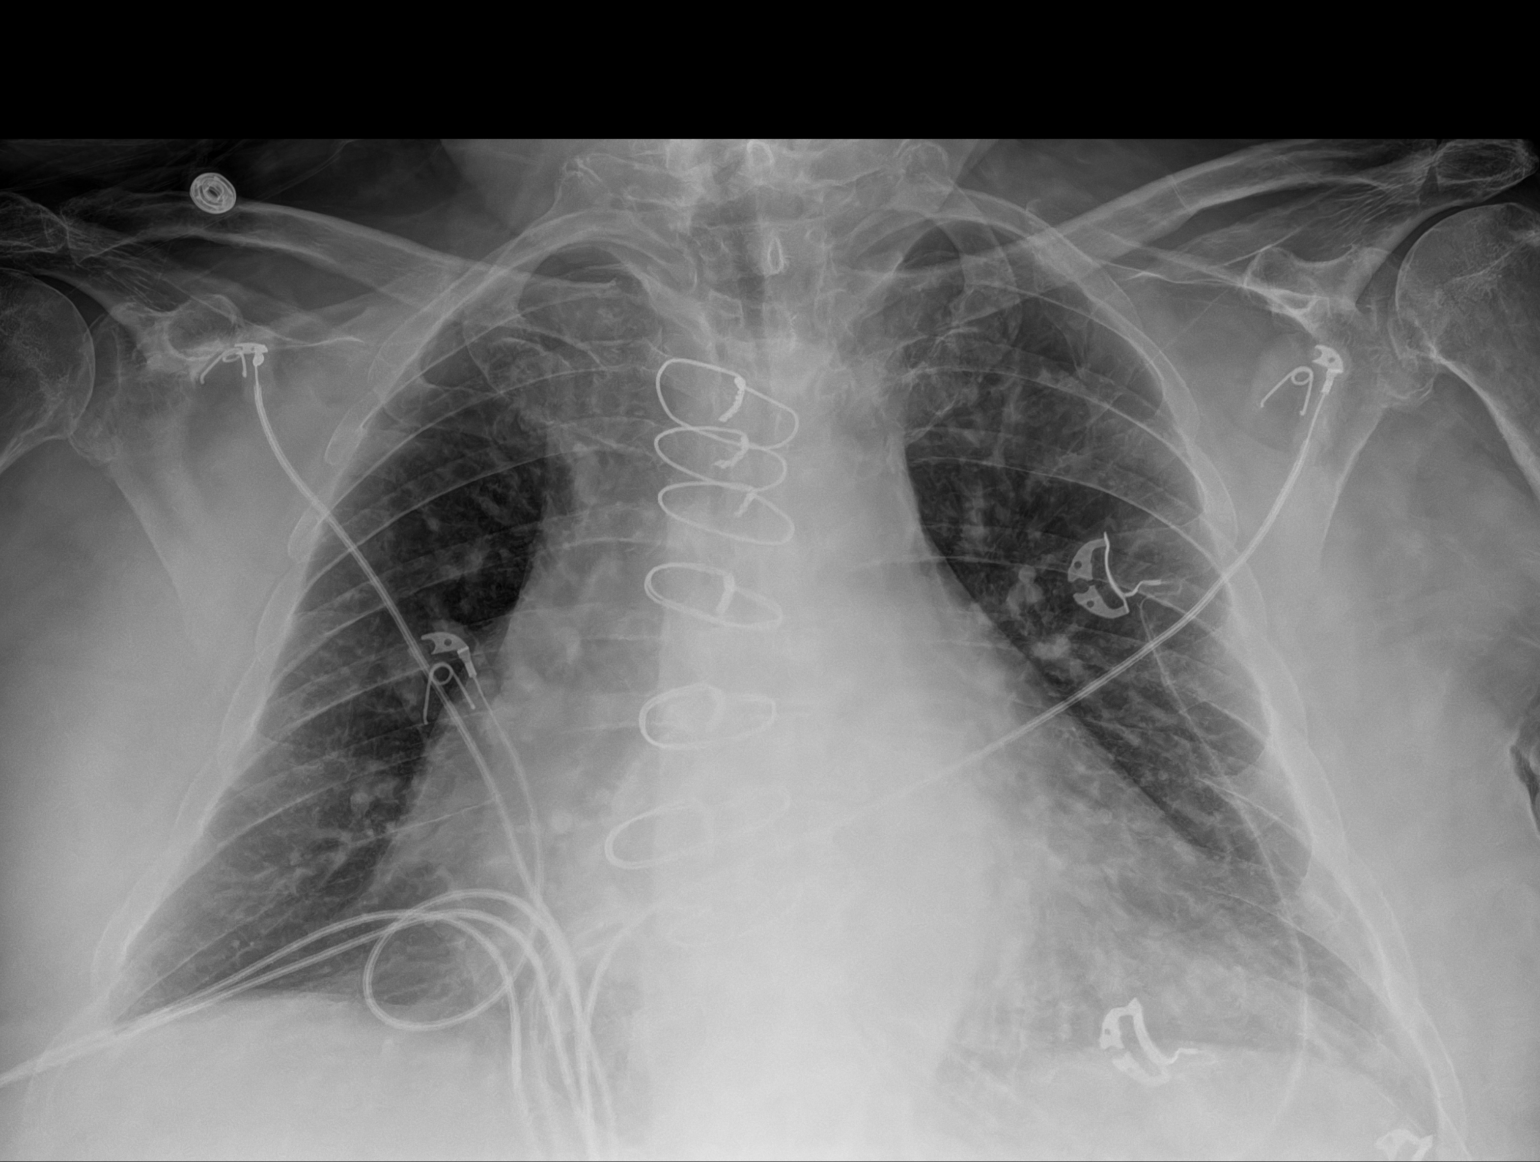

[1 of 1 positions shown; findings below may reference images not displayed]

FINDINGS: Median sternotomy. Heart size is enlarged and accentuated by
portable technique.

Minimal opacities at the LEFT lung base.

Vascular congestion without frank edema.

No gross effusion.

On limited assessment no acute skeletal process.
IMPRESSION: 1. Minimal opacities at the LEFT lung base may represent atelectasis
versus developing infection.
2. Vascular congestion without frank edema.
3. Post median sternotomy with cardiac enlargement.

## 2022-01-27 ENCOUNTER — Encounter: Payer: Self-pay | Admitting: *Deleted

## 2022-04-17 ENCOUNTER — Encounter: Payer: Self-pay | Admitting: *Deleted

## 2022-12-03 ENCOUNTER — Encounter: Payer: Self-pay | Admitting: Physician Assistant

## 2022-12-03 ENCOUNTER — Telehealth: Payer: Self-pay | Admitting: Physician Assistant

## 2022-12-03 NOTE — Telephone Encounter (Signed)
Attempted to schedule appointment. Phone number no longer in service.

## 2022-12-03 NOTE — Telephone Encounter (Signed)
Mailed letter for Patient to schedule overdue CPE
